# Patient Record
Sex: Female | Born: 1975 | Race: White | Hispanic: No | Marital: Married | Smoking: Never smoker
Health system: Southern US, Community
[De-identification: ages and names within clinical notes are randomized; demographics above are authoritative.]

## PROBLEM LIST (undated history)

## (undated) DIAGNOSIS — F32A Depression, unspecified: Secondary | ICD-10-CM

## (undated) DIAGNOSIS — I10 Essential (primary) hypertension: Secondary | ICD-10-CM

## (undated) DIAGNOSIS — G43909 Migraine, unspecified, not intractable, without status migrainosus: Secondary | ICD-10-CM

## (undated) DIAGNOSIS — B009 Herpesviral infection, unspecified: Secondary | ICD-10-CM

## (undated) DIAGNOSIS — J45909 Unspecified asthma, uncomplicated: Secondary | ICD-10-CM

## (undated) DIAGNOSIS — K5792 Diverticulitis of intestine, part unspecified, without perforation or abscess without bleeding: Secondary | ICD-10-CM

## (undated) DIAGNOSIS — F419 Anxiety disorder, unspecified: Secondary | ICD-10-CM

## (undated) DIAGNOSIS — F329 Major depressive disorder, single episode, unspecified: Secondary | ICD-10-CM

## (undated) HISTORY — DX: Major depressive disorder, single episode, unspecified: F32.9

## (undated) HISTORY — DX: Essential (primary) hypertension: I10

## (undated) HISTORY — PX: LEEP: SHX91

## (undated) HISTORY — PX: APPENDECTOMY: SHX54

## (undated) HISTORY — DX: Depression, unspecified: F32.A

## (undated) HISTORY — PX: INSERTION OF MESH: SHX5868

## (undated) HISTORY — DX: Herpesviral infection, unspecified: B00.9

## (undated) HISTORY — DX: Unspecified asthma, uncomplicated: J45.909

## (undated) HISTORY — DX: Anxiety disorder, unspecified: F41.9

## (undated) HISTORY — DX: Migraine, unspecified, not intractable, without status migrainosus: G43.909

## (undated) HISTORY — DX: Diverticulitis of intestine, part unspecified, without perforation or abscess without bleeding: K57.92

---

## 2001-11-26 ENCOUNTER — Other Ambulatory Visit: Admission: RE | Admit: 2001-11-26 | Discharge: 2001-11-26 | Payer: Self-pay | Admitting: Obstetrics and Gynecology

## 2003-02-23 ENCOUNTER — Other Ambulatory Visit: Admission: RE | Admit: 2003-02-23 | Discharge: 2003-02-23 | Payer: Self-pay | Admitting: Obstetrics and Gynecology

## 2003-06-22 ENCOUNTER — Encounter: Payer: Self-pay | Admitting: Obstetrics and Gynecology

## 2003-06-22 ENCOUNTER — Encounter: Admission: RE | Admit: 2003-06-22 | Discharge: 2003-06-22 | Payer: Self-pay | Admitting: Obstetrics and Gynecology

## 2004-02-23 ENCOUNTER — Other Ambulatory Visit: Admission: RE | Admit: 2004-02-23 | Discharge: 2004-02-23 | Payer: Self-pay | Admitting: Obstetrics and Gynecology

## 2004-06-27 ENCOUNTER — Ambulatory Visit (HOSPITAL_COMMUNITY): Admission: RE | Admit: 2004-06-27 | Discharge: 2004-06-27 | Payer: Self-pay | Admitting: Obstetrics and Gynecology

## 2004-09-13 ENCOUNTER — Inpatient Hospital Stay (HOSPITAL_COMMUNITY): Admission: RE | Admit: 2004-09-13 | Discharge: 2004-09-16 | Payer: Self-pay | Admitting: Obstetrics and Gynecology

## 2004-09-19 ENCOUNTER — Encounter: Admission: RE | Admit: 2004-09-19 | Discharge: 2004-10-19 | Payer: Self-pay | Admitting: Obstetrics and Gynecology

## 2004-11-19 ENCOUNTER — Encounter: Admission: RE | Admit: 2004-11-19 | Discharge: 2004-12-19 | Payer: Self-pay | Admitting: Obstetrics and Gynecology

## 2005-09-05 ENCOUNTER — Other Ambulatory Visit: Admission: RE | Admit: 2005-09-05 | Discharge: 2005-09-05 | Payer: Self-pay | Admitting: Obstetrics and Gynecology

## 2006-09-06 ENCOUNTER — Other Ambulatory Visit: Admission: RE | Admit: 2006-09-06 | Discharge: 2006-09-06 | Payer: Self-pay | Admitting: Obstetrics and Gynecology

## 2007-01-13 ENCOUNTER — Ambulatory Visit: Payer: Self-pay | Admitting: Family Medicine

## 2007-01-29 ENCOUNTER — Ambulatory Visit: Payer: Self-pay | Admitting: Family Medicine

## 2007-06-10 ENCOUNTER — Ambulatory Visit: Payer: Self-pay | Admitting: Family Medicine

## 2007-12-08 ENCOUNTER — Ambulatory Visit (HOSPITAL_COMMUNITY): Admission: RE | Admit: 2007-12-08 | Discharge: 2007-12-08 | Payer: Self-pay | Admitting: Obstetrics and Gynecology

## 2008-02-15 ENCOUNTER — Inpatient Hospital Stay (HOSPITAL_COMMUNITY): Admission: AD | Admit: 2008-02-15 | Discharge: 2008-02-17 | Payer: Self-pay | Admitting: Obstetrics and Gynecology

## 2008-11-23 ENCOUNTER — Ambulatory Visit: Payer: Self-pay | Admitting: Family Medicine

## 2009-02-02 ENCOUNTER — Ambulatory Visit: Payer: Self-pay | Admitting: Family Medicine

## 2009-08-10 ENCOUNTER — Ambulatory Visit: Payer: Self-pay | Admitting: Family Medicine

## 2009-09-08 ENCOUNTER — Ambulatory Visit: Payer: Self-pay | Admitting: Family Medicine

## 2010-02-03 ENCOUNTER — Ambulatory Visit: Payer: Self-pay | Admitting: Family Medicine

## 2010-02-08 ENCOUNTER — Ambulatory Visit: Payer: Self-pay | Admitting: Physician Assistant

## 2010-02-09 ENCOUNTER — Encounter: Admission: RE | Admit: 2010-02-09 | Discharge: 2010-02-09 | Payer: Self-pay | Admitting: Family Medicine

## 2010-10-25 ENCOUNTER — Ambulatory Visit: Payer: Self-pay | Admitting: Family Medicine

## 2010-11-16 ENCOUNTER — Ambulatory Visit
Admission: RE | Admit: 2010-11-16 | Discharge: 2010-11-16 | Payer: Self-pay | Source: Home / Self Care | Attending: Family Medicine | Admitting: Family Medicine

## 2010-11-19 LAB — HM COLONOSCOPY

## 2010-12-14 ENCOUNTER — Ambulatory Visit: Admit: 2010-12-14 | Payer: Self-pay | Admitting: Family Medicine

## 2011-04-03 NOTE — Discharge Summary (Signed)
[name], [name]              ACCOUNT NO.:  [id number]   MEDICAL RECORD NO.:  [id number]          PATIENT TYPE:  INP   LOCATION:  9107                          FACILITY:  [facility]   PHYSICIAN:  Malachi Pro. Ambrose Mantle, M.D. DATE OF BIRTH:  12/17/1975   DATE OF ADMISSION:  02/15/2008  DATE OF DISCHARGE:  02/17/2008                               DISCHARGE SUMMARY   HISTORY:  A 35 year old white married female para 1-0-0-1, gravida 2,  EDC March 01, 2008, admitted with active labor and pregnancy-induced  high blood pressure.  Blood group and type A negative, negative  antibody, nonreactive serology, rubella immune, hepatitis B surface  antigen negative, HIV negative, first trimester screen negative, group B  strep negative, and 1-hour Glucola 97.  Early ultrasounds placed the due  date at March 01, 2008.  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  She has been on Valtrex for suppression  since that time.  She began contracting on the day of admission, came to  MAU for evaluation.  She was 2-3 cm in the MAU, was admitted, and by the  time I saw  her, was 6 cm dilated.   ALLERGIES:  No known allergies.   PAST SURGICAL HISTORY:  Appendectomy in 1992 and LEEP procedure.   ILLNESSES:  Asthma, migraines, and herpes.   SOCIAL HISTORY:  Alcohol, tobacco, and drugs none.   FAMILY HISTORY:  Mother with high blood pressure, depression, and  alcoholism.  Brother with alcoholism, high blood pressure, and sister  with anxiety.   PHYSICAL EXAMINATION:  VITAL SIGNS:  Normal except for blood pressure  147/91.  HEART/LUNGS:  Normal.  ABDOMEN:  Soft.  GU: Fundal height had been 35 cm on February 09, 2008.  Fetal heart tones  were normal.  Cervix was 6 cm, 100% vertex to zero station.  Membranes  ruptured during my exam and meconium staining was noted.   HOSPITAL COURSE:  The patient requested an epidural.  She received the  epidural, reached full dilatation at 11:43 a.m.   There was meconium  stained fluid.  Fetal heart rate bradycardia, corrected with lateral  displacement of the uterus.  The patient delivered spontaneously OA of a  first degree laceration, a living female infant 5 pounds and 11 ounces.  Apgars of 9 at 1 and 9 at 5 minutes.  The nose and pharynx were  suctioned with DeLee and the bulb with the head on the perineum.  Placenta was intact.  Uterus normal.  Blood loss about 400 mL.  First  degree midline laceration repaired with 3-0 Vicryl.  Postpartum, the  patient did well and was discharged on the second postpartum day.   LABORATORY DATA:  Initial hemoglobin 12.8, hematocrit 36.6, white count  10,100, and platelet count 242,000.  Followup hemoglobin 11.3.  RPR was  nonreactive.  Urinalysis showed no protein.  The [facility] panel was normal  for liver enzymes.  RhoGAM was given on February 16, 2008.   FINAL DIAGNOSES:  1. Intrauterine pregnancy at 38 weeks delivered occipitoanterior.  2. Pregnancy-induced high blood pressure.  FINAL CONDITION:  Improved.   INSTRUCTIONS:  Include our regular discharge instruction booklet.  The  patient is advised to return to the office in 6 weeks.  Motrin 600 mg 30  tablets one every 6 hours as needed for pain is given at discharge.      Malachi Pro. Ambrose Mantle, M.D.  Electronically Signed     TFH/MEDQ  D:  02/17/2008  T:  02/17/2008  Job:  [id number]

## 2011-04-06 NOTE — Discharge Summary (Signed)
[name] [name], [name] [name]              ACCOUNT NO.:  [id number]   MEDICAL RECORD NO.:  [id number]          PATIENT TYPE:  INP   LOCATION:  9145                          [facility]  [facility]   PHYSICIAN:  Zenaida Niece, M.D.DATE OF BIRTH:  1976-06-23   DATE OF ADMISSION:  09/13/2004  DATE OF DISCHARGE:  09/16/2004                                 DISCHARGE SUMMARY   ADMISSION DIAGNOSES:  1.  Intrauterine pregnancy at 39 weeks.  2.  Pregnancy induced hypertension.  3.  History of herpes simplex virus.  4.  Elevated risk of Trisomy 21.   DISCHARGE DIAGNOSES:  1.  Intrauterine pregnancy at 39 weeks.  2.  Pregnancy induced hypertension.  3.  History of herpes simplex virus.  4.  Elevated risk of Trisomy 21.  5.  Postpartum hemorrhage.   PROCEDURE:  On October 27, spontaneous vaginal delivery.   HISTORY OF PRESENT ILLNESS:  This is a 35 year old, white female, G1, P0  with an EGA of [redacted] weeks who presented with complaint of possible rupture of  membranes with contractions, bloody show and positive fetal movement.  Evaluation did not confirm rupture of membranes, but her cervix changed from  1-3 cm.  Blood pressure was slightly elevated with essentially normal labs  and no proteinuria.   PRENATAL COURSE:  Prenatal care complicated by 1/213 risk of Trisomy 21 by  triple screen.  She has been on Valtrex suppression without lesions or  symptoms.  She had an ultrasound on October 5, for size less than dates with  an estimated fetal weight of 2600 g which is 25th-50th percentile for  gestational age and an AFI of 14.6.  She has had reactive nonstress test for  size less than dates and occasional elevated blood pressures with normal  labs.   PRENATAL LABORATORY DATA:  Blood type is A negative with a negative antibody  screen.  RPR nonreactive.  Rubella immune.  Hepatitis B surface antigen  negative.  Gonorrhea and Chlamydia negative.  One-hour Glucola 107.  Group B  Streptococcus negative.   PAST GYNECOLOGIC HISTORY:  History of herpes simplex virus without current  symptoms.  History of LEEP and cryotherapy.   PAST MEDICAL HISTORY:  1.  Asthma.  2.  Migraine headaches.   PAST SURGICAL HISTORY:  Appendectomy.   MEDICATIONS:  Valtrex 500 mg q.d.   PHYSICAL EXAMINATION:  VITAL SIGNS:  Afebrile, stable vital signs except  blood pressure is 120-150s/80-100s.  Fetal heart tracing reactive initially  with irregular contractions.  ABDOMEN:  Gravid, nontender with an estimated fetal weight of 6-1/2 pounds.  PELVIC:  Cervix on my first exam at 5, 90 and 0 with a vertex presentation.  Adequate pelvis and amniotomy revealed possible light meconium.  EXTREMITIES:  1+ edema, nontender.  DTRs were 1/4.   HOSPITAL COURSE:  The patient was admitted after being felt to be in early  labor.  Contractions spaced out and she was started on Pitocin augmentation.  On my first exam, I was able to perform amniotomy for augmentation.  She  progressed into labor and received an epidural.  Blood pressures  remained  slightly elevated.  When Dr. Ambrose Mantle saw the patient on the morning of  October 27, he started her on magnesium sulfate.  On the morning of October  27, she progressed to complete, pushed well and had a vaginal delivery of a  viable female infant with Apgar's of 9 and 9 that weighed 6 pounds 13  ounces.  Placenta delivered intact and uterus palpated normal.  She had a  second-degree midline laceration and bilateral vaginal lacerations repaired  with 3-0 Vicryl and estimated blood loss was 500 cc.  After delivery, Dr.  Ambrose Mantle was called due to heavy vaginal bleeding.  Dr. Ambrose Mantle evaluated her  and felt that there was no significant bleeding and that her fundus was  firm.  Blood pressure was 138/87, pulse was fine and hemoglobin at that  point was 10.6.  Predelivery hemoglobin was 14.3 and then after delivery  10.6.  It fell to 8.4 on postpartum day #1 and 7.7 on postpartum day #2.  She  was kept on magnesium sulfate for approximately 24 hours after delivery.  This was then discontinued and she was sent to the floor.  Blood pressures  remained stable on the floor, although initially slightly labile.  She did  have some trouble controlling her urine, but on postpartum day #2, was felt  to be stable enough for discharge home.   DIET:  Regular.   SPECIAL INSTRUCTIONS:  Pelvic rest.   FOLLOW UP:  Follow up in 6 weeks.   DISCHARGE MEDICATIONS:  Over-the-counter ibuprofen and Percocet #20 one to  two p.o. q.4-6h. p.r.n. pain.  She is given our discharge pamphlet.  Iron  sulfate b.i.d.     Todd   TDM/MEDQ  D:  09/16/2004  T:  09/16/2004  Job:  [id number]

## 2011-08-09 LAB — RH IMMUNE GLOBULIN WORKUP (NOT WOMEN'S HOSP)
ABO/RH(D): A NEG
Antibody Screen: NEGATIVE

## 2011-08-09 LAB — HIV ANTIBODY (ROUTINE TESTING W REFLEX): HIV: NONREACTIVE

## 2011-08-09 LAB — GLUCOSE TOLERANCE, 1 HOUR: Glucose, 1 Hour GTT: 97

## 2011-08-09 LAB — RPR: RPR Ser Ql: NONREACTIVE

## 2011-08-13 LAB — COMPREHENSIVE METABOLIC PANEL
ALT: 16
AST: 22
Albumin: 2.8 — ABNORMAL LOW
Alkaline Phosphatase: 171 — ABNORMAL HIGH
BUN: 7
CO2: 19
Calcium: 8.7
Chloride: 105
Creatinine, Ser: 0.52
GFR calc Af Amer: >60
GFR calc non Af Amer: >60
Glucose, Bld: 92
Potassium: 3.4 — ABNORMAL LOW
Sodium: 133 — ABNORMAL LOW
Total Bilirubin: 0.8
Total Protein: 6.9

## 2011-08-13 LAB — CBC
HCT: 32.8 — ABNORMAL LOW
HCT: 36.6
Hemoglobin: 11.3 — ABNORMAL LOW
Hemoglobin: 12.8
MCHC: 34.5
MCHC: 34.8
MCV: 81.6
MCV: 82.2
Platelets: 227
Platelets: 242
RBC: 3.99
RBC: 4.49
RDW: 12.9
RDW: 13.1
WBC: 10.1
WBC: 11.1 — ABNORMAL HIGH

## 2011-08-13 LAB — URINALYSIS, DIPSTICK ONLY
Bilirubin Urine: NEGATIVE
Glucose, UA: NEGATIVE
Hgb urine dipstick: NEGATIVE
Ketones, ur: >80 — AB
Leukocytes, UA: NEGATIVE
Nitrite: NEGATIVE
Protein, ur: NEGATIVE
Specific Gravity, Urine: 1.025
Urobilinogen, UA: 1
pH: 6.5

## 2011-08-13 LAB — URIC ACID: Uric Acid, Serum: 4.2

## 2011-08-13 LAB — RPR: RPR Ser Ql: NONREACTIVE

## 2011-08-13 LAB — RH IMMUNE GLOB WKUP(>/=20WKS)(NOT WOMEN'S HOSP): Fetal Screen: NEGATIVE

## 2011-08-13 LAB — LACTATE DEHYDROGENASE: LDH: 141

## 2013-05-19 LAB — HM PAP SMEAR: HM Pap smear: NORMAL

## 2013-11-09 ENCOUNTER — Encounter: Payer: Self-pay | Admitting: Internal Medicine

## 2013-11-10 ENCOUNTER — Ambulatory Visit: Payer: Self-pay | Admitting: Physician Assistant

## 2014-02-18 ENCOUNTER — Other Ambulatory Visit: Payer: Self-pay | Admitting: Emergency Medicine

## 2014-03-20 ENCOUNTER — Other Ambulatory Visit: Payer: Self-pay | Admitting: Emergency Medicine

## 2014-03-23 ENCOUNTER — Encounter: Payer: Self-pay | Admitting: Internal Medicine

## 2014-03-23 NOTE — Progress Notes (Signed)
Patient ID: [name] [name], female   DOB: [birth year], 38 y.o.   MRN: [id number]     Stephenie [facility] O   S H O W

## 2014-03-24 ENCOUNTER — Encounter: Payer: Self-pay | Admitting: Internal Medicine

## 2014-05-03 ENCOUNTER — Encounter: Payer: Self-pay | Admitting: Family Medicine

## 2014-05-03 ENCOUNTER — Ambulatory Visit (INDEPENDENT_AMBULATORY_CARE_PROVIDER_SITE_OTHER): Payer: BC Managed Care – PPO | Admitting: Family Medicine

## 2014-05-03 VITALS — BP 120/80 | HR 76 | Temp 98.1°F | Resp 16 | Wt 181.1 lb

## 2014-05-03 DIAGNOSIS — R319 Hematuria, unspecified: Secondary | ICD-10-CM

## 2014-05-03 DIAGNOSIS — R82998 Other abnormal findings in urine: Secondary | ICD-10-CM

## 2014-05-03 DIAGNOSIS — R300 Dysuria: Secondary | ICD-10-CM

## 2014-05-03 DIAGNOSIS — H6980 Other specified disorders of Eustachian tube, unspecified ear: Secondary | ICD-10-CM

## 2014-05-03 DIAGNOSIS — R8290 Unspecified abnormal findings in urine: Secondary | ICD-10-CM | POA: Insufficient documentation

## 2014-05-03 DIAGNOSIS — H6990 Unspecified Eustachian tube disorder, unspecified ear: Secondary | ICD-10-CM | POA: Insufficient documentation

## 2014-05-03 LAB — POCT URINALYSIS DIPSTICK
Bilirubin, UA: NEGATIVE
Glucose, UA: NEGATIVE
Ketones, UA: NEGATIVE
Nitrite, UA: NEGATIVE
Spec Grav, UA: >=1.03
Urobilinogen, UA: 0.2
pH, UA: 6

## 2014-05-03 NOTE — Progress Notes (Signed)
   Subjective:    Patient ID: [name] [name], female    DOB: 1976-10-19, 38 y.o.   MRN: [id number]  Dysuria    New to establish.  Previous MD- [name] FeeMelissa Smith PA [facility]  Urinary symptoms- pt reports she had Mirena inserted in September and since then, 'urine has been really strong'.  For last month urine has smelled worse and now w/ back pain.  No burning w/ urination.  Some increased frequency but this corresponds w/ increased water intake.  No urgency.  Ear clogged- L ear, has sensation of water in the ear despite no recent swimming.  sxs started 1 week ago.  No drainage.  + itching.   Review of Systems  Genitourinary: Positive for dysuria.   For ROS see HPI     Objective:   Physical Exam  Vitals reviewed. Constitutional: She appears well-developed and well-nourished. No distress.  HENT:  Head: Normocephalic and atraumatic.  Right Ear: Tympanic membrane normal.  Left Ear: Tympanic membrane normal.  Nose: Mucosal edema and rhinorrhea present. Right sinus exhibits no maxillary sinus tenderness and no frontal sinus tenderness. Left sinus exhibits no maxillary sinus tenderness and no frontal sinus tenderness.  Mouth/Throat: Mucous membranes are normal. Posterior oropharyngeal erythema (w/ PND) present.  Eyes: Conjunctivae and EOM are normal. Pupils are equal, round, and reactive to light.  Neck: Normal range of motion. Neck supple.  Cardiovascular: Normal rate, regular rhythm and normal heart sounds.   Pulmonary/Chest: Effort normal and breath sounds normal. No respiratory distress. She has no wheezes. She has no rales.  Abdominal: Soft. Bowel sounds are normal. She exhibits no distension. There is no tenderness. There is no rebound.  Lymphadenopathy:    She has no cervical adenopathy.          Assessment & Plan:

## 2014-05-03 NOTE — Progress Notes (Signed)
Pre visit review using our clinic review tool, if applicable. No additional management support is needed unless otherwise documented below in the visit note. 

## 2014-05-03 NOTE — Patient Instructions (Signed)
Follow up as scheduled Increase your fluid intake We'll notify you of your urine culture results and treat as needed Start Claritin or Zyrtec daily to decrease the ear pressure and relieve that clogged feeling Call with any questions or concerns Welcome!  We're glad to have you!!!

## 2014-05-04 ENCOUNTER — Encounter: Payer: Self-pay | Admitting: General Practice

## 2014-05-04 NOTE — Assessment & Plan Note (Signed)
New.  sxs are more consistent w/ hormonal change or dehydration than UTI but will send urine for cx.  No abx until cx results available.  Pt expressed understanding and is in agreement w/ plan.

## 2014-05-04 NOTE — Assessment & Plan Note (Signed)
New.  No evidence of wax buildup or infxn.  + nasal allergies on PE.  Suspect Eustachian tube dysfunction is the cause of her ear fullness- start OTC antihistamine daily.  Reviewed supportive care and red flags that should prompt return.  Pt expressed understanding and is in agreement w/ plan.

## 2014-05-05 LAB — URINE CULTURE
Colony Count: NO GROWTH
Organism ID, Bacteria: NO GROWTH

## 2014-05-06 ENCOUNTER — Telehealth: Payer: Self-pay | Admitting: Family Medicine

## 2014-05-06 ENCOUNTER — Encounter: Payer: Self-pay | Admitting: General Practice

## 2014-05-06 NOTE — Telephone Encounter (Signed)
Caller name: Aram BeechamCynthia  Call back [phone]    Reason for call:  Pt had received a call regarding labs.  Would like a call back.

## 2014-05-07 NOTE — Telephone Encounter (Signed)
Pt notified of lab results. Verbalized understanding / no questions.

## 2014-08-06 ENCOUNTER — Ambulatory Visit (INDEPENDENT_AMBULATORY_CARE_PROVIDER_SITE_OTHER): Payer: BC Managed Care – PPO | Admitting: Family Medicine

## 2014-08-06 ENCOUNTER — Other Ambulatory Visit: Payer: Self-pay | Admitting: Family Medicine

## 2014-08-06 ENCOUNTER — Encounter: Payer: Self-pay | Admitting: Family Medicine

## 2014-08-06 VITALS — BP 124/82 | HR 80 | Temp 98.0°F | Resp 16 | Ht 67.5 in | Wt 177.1 lb

## 2014-08-06 DIAGNOSIS — E663 Overweight: Secondary | ICD-10-CM | POA: Insufficient documentation

## 2014-08-06 DIAGNOSIS — R5383 Other fatigue: Secondary | ICD-10-CM

## 2014-08-06 DIAGNOSIS — J309 Allergic rhinitis, unspecified: Secondary | ICD-10-CM

## 2014-08-06 DIAGNOSIS — F32A Depression, unspecified: Secondary | ICD-10-CM

## 2014-08-06 DIAGNOSIS — R8290 Unspecified abnormal findings in urine: Secondary | ICD-10-CM

## 2014-08-06 DIAGNOSIS — J302 Other seasonal allergic rhinitis: Secondary | ICD-10-CM | POA: Insufficient documentation

## 2014-08-06 DIAGNOSIS — F3289 Other specified depressive episodes: Secondary | ICD-10-CM

## 2014-08-06 DIAGNOSIS — K219 Gastro-esophageal reflux disease without esophagitis: Secondary | ICD-10-CM | POA: Insufficient documentation

## 2014-08-06 DIAGNOSIS — N393 Stress incontinence (female) (male): Secondary | ICD-10-CM | POA: Insufficient documentation

## 2014-08-06 DIAGNOSIS — R82998 Other abnormal findings in urine: Secondary | ICD-10-CM

## 2014-08-06 DIAGNOSIS — R5381 Other malaise: Secondary | ICD-10-CM

## 2014-08-06 DIAGNOSIS — F329 Major depressive disorder, single episode, unspecified: Secondary | ICD-10-CM

## 2014-08-06 DIAGNOSIS — Z23 Encounter for immunization: Secondary | ICD-10-CM

## 2014-08-06 LAB — CBC WITH DIFFERENTIAL/PLATELET
Basophils Absolute: 0 10*3/uL (ref 0.0–0.1)
Basophils Relative: 0 % (ref 0–1)
Eosinophils Absolute: 0.1 10*3/uL (ref 0.0–0.7)
Eosinophils Relative: 1 % (ref 0–5)
HCT: 40.8 % (ref 36.0–46.0)
Hemoglobin: 14.9 g/dL (ref 12.0–15.0)
Lymphocytes Relative: 30 % (ref 12–46)
Lymphs Abs: 2.4 10*3/uL (ref 0.7–4.0)
MCH: 28.6 pg (ref 26.0–34.0)
MCHC: 36.5 g/dL — ABNORMAL HIGH (ref 30.0–36.0)
MCV: 78.3 fL (ref 78.0–100.0)
Monocytes Absolute: 0.6 10*3/uL (ref 0.1–1.0)
Monocytes Relative: 8 % (ref 3–12)
Neutro Abs: 4.9 10*3/uL (ref 1.7–7.7)
Neutrophils Relative %: 61 % (ref 43–77)
Platelets: 262 10*3/uL (ref 150–400)
RBC: 5.21 MIL/uL — ABNORMAL HIGH (ref 3.87–5.11)
RDW: 12.9 % (ref 11.5–15.5)
WBC: 8.1 10*3/uL (ref 4.0–10.5)

## 2014-08-06 LAB — BASIC METABOLIC PANEL
BUN: 13 mg/dL (ref 6–23)
CO2: 25 mEq/L (ref 19–32)
Calcium: 9.4 mg/dL (ref 8.4–10.5)
Chloride: 102 mEq/L (ref 96–112)
Creat: 0.78 mg/dL (ref 0.50–1.10)
Glucose, Bld: 85 mg/dL (ref 70–99)
Potassium: 3.9 mEq/L (ref 3.5–5.3)
Sodium: 136 mEq/L (ref 135–145)

## 2014-08-06 LAB — HEPATIC FUNCTION PANEL
ALT: 9 U/L (ref 0–35)
AST: 14 U/L (ref 0–37)
Albumin: 4.5 g/dL (ref 3.5–5.2)
Alkaline Phosphatase: 47 U/L (ref 39–117)
Bilirubin, Direct: 0.1 mg/dL (ref 0.0–0.3)
Indirect Bilirubin: 0.4 mg/dL (ref 0.2–1.2)
Total Bilirubin: 0.5 mg/dL (ref 0.2–1.2)
Total Protein: 7.3 g/dL (ref 6.0–8.3)

## 2014-08-06 LAB — LIPID PANEL
Cholesterol: 188 mg/dL (ref 0–200)
HDL: 57 mg/dL (ref >39)
LDL Cholesterol: 105 mg/dL — ABNORMAL HIGH (ref 0–99)
Total CHOL/HDL Ratio: 3.3 Ratio
Triglycerides: 131 mg/dL (ref <150)
VLDL: 26 mg/dL (ref 0–40)

## 2014-08-06 MED ORDER — OMEPRAZOLE 20 MG PO CPDR: 20.0000 mg | DELAYED_RELEASE_CAPSULE | Freq: Every day | ORAL | Status: DC

## 2014-08-06 NOTE — Progress Notes (Signed)
Pre visit review using our clinic review tool, if applicable. No additional management support is needed unless otherwise documented below in the visit note. 

## 2014-08-06 NOTE — Telephone Encounter (Signed)
Med filled.  

## 2014-08-06 NOTE — Progress Notes (Signed)
   Subjective:    Patient ID: [name] [name], female    DOB: [birth year], 38 y.o.   MRN: [id number]  HPI GERD- pt reports hx of similar 'years' ago.  In the past few months pt feels sxs have returned and she is having a 'constant cough'.  No abd pain, sour brash.  Pt reports the cough predated the PND.  Allergies- pt reports nasal congestion, sinus pressure, itchy eyes, PND.  No sneezing.  Not currently taking any allergy medication.  sxs started 3 weeks ago.  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  Depression- chronic problem, stopped taking Zoloft and 'depression is ok' but 'i have a really short temper'.  Started meds when mom passed away 5 yrs ago.  Pt reports no difficulty w/ sleep, 'super tired all the time'.  Stopped Zoloft b/c 'i hate taking medicine'.  + family hx.  Not interested in counseling.  Stress incontinence- occuring w/ cough, running, walking quickly, jumping.  Since birth of 1st child 10 yrs ago.   Review of Systems For ROS see HPI     Objective:   Physical Exam  Vitals reviewed. Constitutional: She is oriented to person, place, and time. She appears well-developed and well-nourished. No distress.  HENT:  Head: Normocephalic and atraumatic.  Right Ear: Tympanic membrane normal.  Left Ear: Tympanic membrane normal.  Nose: Mucosal edema and rhinorrhea present. Right sinus exhibits no maxillary sinus tenderness and no frontal sinus tenderness. Left sinus exhibits no maxillary sinus tenderness and no frontal sinus tenderness.  Mouth/Throat: Mucous membranes are normal. Posterior oropharyngeal erythema (w/ PND) present.  Eyes: Conjunctivae and EOM are normal. Pupils are equal, round, and reactive to light.  Neck: Normal range of motion. Neck supple.  Cardiovascular: Normal rate, regular rhythm and normal heart sounds.   Pulmonary/Chest: Effort normal and breath sounds normal. No respiratory distress. She has no wheezes. She has no rales.  Abdominal: Soft. Bowel sounds  are normal. She exhibits no distension. There is no tenderness. There is no rebound.  Musculoskeletal: She exhibits no edema.  Lymphadenopathy:    She has no cervical adenopathy.  Neurological: She is alert and oriented to person, place, and time.  Skin: Skin is warm and dry.  Psychiatric: She has a normal mood and affect. Her behavior is normal. Thought content normal.          Assessment & Plan:

## 2014-08-06 NOTE — Patient Instructions (Signed)
Schedule your complete physical in 6 months We'll notify you of your lab results and make any changes if needed We'll call you with your urology appt for the stress incontinence Please work on Kegel exercises to strengthen your pelvic floor Start daily OTC claritin or zyrtec for the allergies Start daily Omeprazole for the acid reflux If you change your mind about depression medication- please call me Call with any questions or concerns Happy [birth year] Birthday!

## 2014-08-07 LAB — TSH: TSH: 0.833 u[IU]/mL (ref 0.350–4.500)

## 2014-08-07 LAB — VITAMIN D 25 HYDROXY (VIT D DEFICIENCY, FRACTURES): Vit D, 25-Hydroxy: 60 ng/mL (ref 30–89)

## 2014-08-08 NOTE — Assessment & Plan Note (Signed)
New to provider, ongoing for pt.  She weaned off meds b/c 'i don't like taking medicine'.  Pt admits to increased fatigue and short fuse but refusing medication and counseling- 'i'll just deal w/ it'.  Will follow and treat once pt is ready.

## 2014-08-08 NOTE — Assessment & Plan Note (Signed)
New.  Encouraged pt to make healthy food choices and get regular exercise.  Will get labs to risk stratify.  Will follow at upcoming appts

## 2014-08-08 NOTE — Assessment & Plan Note (Signed)
New to provider, ongoing for pt.  She feels strongly that her chronic cough is due to GERD.  Start low dose PPI and monitor for sxs improvement.  Will follow.

## 2014-08-08 NOTE — Assessment & Plan Note (Signed)
New to provider, ongoing for pt since birth of 1st child 10 yrs ago.  Pt encouraged to work on Kegel exercises to strengthen pelvic floor.  Will refer to urology for evaluation and tx.

## 2014-08-08 NOTE — Assessment & Plan Note (Signed)
Pt w/ hx of similar.  Discussed that diet and medication intake can alter urine odor.  Repeat UA and send for cx if needed.

## 2014-08-08 NOTE — Assessment & Plan Note (Signed)
New.  Suspect this is due to pt's currently untreated depression but will get labs to r/o underlying metabolic abnormality.

## 2014-08-08 NOTE — Assessment & Plan Note (Signed)
New.  Pt reports she is not taking anything to manage her sxs.  Start OTC antihistamine daily.

## 2014-08-09 ENCOUNTER — Encounter: Payer: Self-pay | Admitting: General Practice

## 2014-08-09 LAB — POCT URINALYSIS DIPSTICK
Bilirubin, UA: NEGATIVE
Blood, UA: 10
Glucose, UA: NEGATIVE
Ketones, UA: NEGATIVE
Leukocytes, UA: NEGATIVE
Nitrite, UA: NEGATIVE
Protein, UA: NEGATIVE
Spec Grav, UA: 1.015
Urobilinogen, UA: 0.2
pH, UA: 6

## 2014-08-09 NOTE — Addendum Note (Signed)
Addended by: Eustace Quail on: 08/09/2014 09:30 AM   Modules accepted: Orders

## 2014-08-11 ENCOUNTER — Encounter: Payer: Self-pay | Admitting: General Practice

## 2014-08-11 ENCOUNTER — Telehealth: Payer: Self-pay | Admitting: Lab

## 2014-08-11 LAB — URINE CULTURE: Colony Count: >=100000

## 2014-08-11 LAB — FOLATE: Folate: 19.1 ng/mL

## 2014-08-11 LAB — VITAMIN B12: Vitamin B-12: 742 pg/mL (ref 211–911)

## 2014-08-11 NOTE — Telephone Encounter (Signed)
Message copied by Vance Gather on Wed Aug 11, 2014 10:42 AM ------      Message from: Sheliah Hatch      Created: Wed Aug 11, 2014  8:28 AM       Please call and check on status of B12/folate            ----- Message -----         From: SYSTEM         Sent: 08/11/2014  12:01 AM           To: Sheliah Hatch, MD                   ------

## 2014-08-11 NOTE — Telephone Encounter (Signed)
Dr. Beverely Low    I just talked to [facility] you should have your result for the vitamin b12 and folate by tomorrow.

## 2014-08-12 ENCOUNTER — Other Ambulatory Visit: Payer: Self-pay | Admitting: General Practice

## 2014-08-12 MED ORDER — CEPHALEXIN 500 MG PO CAPS: 500.0000 mg | ORAL_CAPSULE | Freq: Two times a day (BID) | ORAL | Status: DC

## 2014-09-08 ENCOUNTER — Other Ambulatory Visit: Payer: Self-pay | Admitting: Family Medicine

## 2014-09-08 ENCOUNTER — Telehealth: Payer: Self-pay | Admitting: Family Medicine

## 2014-09-08 MED ORDER — ESCITALOPRAM OXALATE 10 MG PO TABS: 10.0000 mg | ORAL_TABLET | Freq: Every day | ORAL | Status: DC

## 2014-09-08 NOTE — Telephone Encounter (Signed)
Lexapro 10mg  daily would be a good option if she is interested

## 2014-09-08 NOTE — Telephone Encounter (Signed)
Med filled.  

## 2014-09-08 NOTE — Telephone Encounter (Signed)
Pt notified and med filled.  

## 2014-09-08 NOTE — Telephone Encounter (Signed)
Caller name: Aram BeechamCynthia  Call back number:[phone] [facility] [address]  Reason for call:  Pt tried to wean herself off of her off the Rx sertraline (ZOLOFT) 100 MG tablet [[id number]][[id number]] DISCONTINUED, but she feels that she may want to be back on something, prob not the Zoloft.  Would like to discuss.  Please call.

## 2014-09-23 ENCOUNTER — Encounter: Payer: Self-pay | Admitting: Emergency Medicine

## 2015-01-11 ENCOUNTER — Other Ambulatory Visit: Payer: Self-pay | Admitting: Family Medicine

## 2015-01-12 NOTE — Telephone Encounter (Signed)
Med filled.  

## 2015-02-14 ENCOUNTER — Ambulatory Visit: Payer: BC Managed Care – PPO | Admitting: Family Medicine

## 2015-02-28 ENCOUNTER — Ambulatory Visit (INDEPENDENT_AMBULATORY_CARE_PROVIDER_SITE_OTHER): Payer: BC Managed Care – PPO | Admitting: Family Medicine

## 2015-02-28 ENCOUNTER — Encounter: Payer: Self-pay | Admitting: Family Medicine

## 2015-02-28 VITALS — BP 120/80 | HR 67 | Temp 98.2°F | Resp 16 | Ht 68.0 in | Wt 181.0 lb

## 2015-02-28 DIAGNOSIS — Z0000 Encounter for general adult medical examination without abnormal findings: Secondary | ICD-10-CM

## 2015-02-28 NOTE — Assessment & Plan Note (Signed)
Pt's PE WNL w/ exception of being overweight.  UTD on GYN.  Check labs.  Anticipatory guidance provided.  

## 2015-02-28 NOTE — Patient Instructions (Signed)
Follow up in 1 year or as needed We'll notify you of your lab results and make any changes if needed Keep up the good work on healthy diet and regular exercise- you can do this! Call with any questions or concerns Happy Spring!!!

## 2015-02-28 NOTE — Progress Notes (Signed)
   Subjective:    Patient ID: [name] [name], female    DOB: [birth year], 39 y.o.   MRN: [id number]  HPI CPE- UTD on pap w/ Dr Senaida Oresichardson.     Review of Systems Patient reports no vision/ hearing changes, adenopathy,fever, weight change,  persistant/recurrent hoarseness , swallowing issues, chest pain, palpitations, edema, persistant/recurrent cough, hemoptysis, dyspnea (rest/exertional/paroxysmal nocturnal), gastrointestinal bleeding (melena, rectal bleeding), abdominal pain, significant heartburn, bowel changes, GU symptoms (dysuria, hematuria, incontinence), Gyn symptoms (abnormal  bleeding, pain),  syncope, focal weakness, memory loss, numbness & tingling, skin/hair/nail changes, abnormal bruising or bleeding, anxiety, or depression.     Objective:   Physical Exam General Appearance:    Alert, cooperative, no distress, appears stated age  Head:    Normocephalic, without obvious abnormality, atraumatic  Eyes:    PERRL, conjunctiva/corneas clear, EOM's intact, fundi    benign, both eyes  Ears:    Normal TM's and external ear canals, both ears  Nose:   Nares normal, septum midline, mucosa normal, no drainage    or sinus tenderness  Throat:   Lips, mucosa, and tongue normal; teeth and gums normal  Neck:   Supple, symmetrical, trachea midline, no adenopathy;    Thyroid: no enlargement/tenderness/nodules  Back:     Symmetric, no curvature, ROM normal, no CVA tenderness  Lungs:     Clear to auscultation bilaterally, respirations unlabored  Chest Wall:    No tenderness or deformity   Heart:    Regular rate and rhythm, S1 and S2 normal, no murmur, rub   or gallop  Breast Exam:    Deferred to GYN  Abdomen:     Soft, non-tender, bowel sounds active all four quadrants,    no masses, no organomegaly  Genitalia:    Deferred to GYN  Rectal:    Extremities:   Extremities normal, atraumatic, no cyanosis or edema  Pulses:   2+ and symmetric all extremities  Skin:   Skin color, texture, turgor  normal, no rashes or lesions  Lymph nodes:   Cervical, supraclavicular, and axillary nodes normal  Neurologic:   CNII-XII intact, normal strength, sensation and reflexes    throughout          Assessment & Plan:

## 2015-02-28 NOTE — Progress Notes (Signed)
Pre visit review using our clinic review tool, if applicable. No additional management support is needed unless otherwise documented below in the visit note. 

## 2015-03-01 LAB — BASIC METABOLIC PANEL
BUN: 11 mg/dL (ref 6–23)
CO2: 28 mEq/L (ref 19–32)
Calcium: 9.3 mg/dL (ref 8.4–10.5)
Chloride: 103 mEq/L (ref 96–112)
Creatinine, Ser: 0.66 mg/dL (ref 0.40–1.20)
GFR: 105.65 mL/min (ref >60.00)
Glucose, Bld: 69 mg/dL — ABNORMAL LOW (ref 70–99)
Potassium: 3.9 mEq/L (ref 3.5–5.1)
Sodium: 137 mEq/L (ref 135–145)

## 2015-03-01 LAB — LIPID PANEL
Cholesterol: 207 mg/dL — ABNORMAL HIGH (ref 0–200)
HDL: 64.7 mg/dL (ref >39.00)
LDL Cholesterol: 127 mg/dL — ABNORMAL HIGH (ref 0–99)
NonHDL: 142.3
Total CHOL/HDL Ratio: 3
Triglycerides: 75 mg/dL (ref 0.0–149.0)
VLDL: 15 mg/dL (ref 0.0–40.0)

## 2015-03-01 LAB — CBC WITH DIFFERENTIAL/PLATELET
Basophils Absolute: 0 10*3/uL (ref 0.0–0.1)
Basophils Relative: 0.5 % (ref 0.0–3.0)
Eosinophils Absolute: 0.1 10*3/uL (ref 0.0–0.7)
Eosinophils Relative: 1.1 % (ref 0.0–5.0)
HCT: 43 % (ref 36.0–46.0)
Hemoglobin: 14.4 g/dL (ref 12.0–15.0)
Lymphocytes Relative: 35.3 % (ref 12.0–46.0)
Lymphs Abs: 2.7 10*3/uL (ref 0.7–4.0)
MCHC: 33.6 g/dL (ref 30.0–36.0)
MCV: 84.1 fl (ref 78.0–100.0)
Monocytes Absolute: 0.5 10*3/uL (ref 0.1–1.0)
Monocytes Relative: 6.9 % (ref 3.0–12.0)
Neutro Abs: 4.2 10*3/uL (ref 1.4–7.7)
Neutrophils Relative %: 56.2 % (ref 43.0–77.0)
Platelets: 253 10*3/uL (ref 150.0–400.0)
RBC: 5.11 Mil/uL (ref 3.87–5.11)
RDW: 13 % (ref 11.5–15.5)
WBC: 7.5 10*3/uL (ref 4.0–10.5)

## 2015-03-01 LAB — TSH: TSH: 1.27 u[IU]/mL (ref 0.35–4.50)

## 2015-03-01 LAB — HEPATIC FUNCTION PANEL
ALT: 11 U/L (ref 0–35)
AST: 17 U/L (ref 0–37)
Albumin: 4.3 g/dL (ref 3.5–5.2)
Alkaline Phosphatase: 43 U/L (ref 39–117)
Bilirubin, Direct: 0.1 mg/dL (ref 0.0–0.3)
Total Bilirubin: 0.6 mg/dL (ref 0.2–1.2)
Total Protein: 7.5 g/dL (ref 6.0–8.3)

## 2015-03-01 LAB — VITAMIN D 25 HYDROXY (VIT D DEFICIENCY, FRACTURES): VITD: 37.33 ng/mL (ref 30.00–100.00)

## 2015-03-02 ENCOUNTER — Encounter: Payer: Self-pay | Admitting: General Practice

## 2015-04-17 ENCOUNTER — Other Ambulatory Visit: Payer: Self-pay | Admitting: Family Medicine

## 2015-04-19 NOTE — Telephone Encounter (Signed)
Med filled.  

## 2015-06-28 ENCOUNTER — Other Ambulatory Visit: Payer: Self-pay | Admitting: Family Medicine

## 2015-06-28 NOTE — Telephone Encounter (Signed)
Medication filled to pharmacy as requested.   

## 2015-10-25 ENCOUNTER — Other Ambulatory Visit: Payer: Self-pay | Admitting: Family Medicine

## 2015-10-26 NOTE — Telephone Encounter (Signed)
Medication filled to pharmacy as requested.   

## 2015-12-26 ENCOUNTER — Encounter: Payer: Self-pay | Admitting: Family Medicine

## 2015-12-26 ENCOUNTER — Ambulatory Visit (INDEPENDENT_AMBULATORY_CARE_PROVIDER_SITE_OTHER): Payer: BC Managed Care – PPO | Admitting: Family Medicine

## 2015-12-26 VITALS — BP 122/86 | HR 82 | Temp 97.9°F | Ht 68.0 in | Wt 186.0 lb

## 2015-12-26 DIAGNOSIS — F329 Major depressive disorder, single episode, unspecified: Secondary | ICD-10-CM

## 2015-12-26 DIAGNOSIS — F32A Depression, unspecified: Secondary | ICD-10-CM

## 2015-12-26 DIAGNOSIS — R5383 Other fatigue: Secondary | ICD-10-CM | POA: Insufficient documentation

## 2015-12-26 MED ORDER — ESCITALOPRAM OXALATE 20 MG PO TABS: 20.0000 mg | ORAL_TABLET | Freq: Every day | ORAL | Status: DC

## 2015-12-26 NOTE — Progress Notes (Signed)
Pre visit review using our clinic review tool, if applicable. No additional management support is needed unless otherwise documented below in the visit note. 

## 2015-12-26 NOTE — Progress Notes (Signed)
   Subjective:    Patient ID: [name] [name], female    DOB: 08/31/1976, 40 y.o.   MRN: [id number]  HPI Miserably tired- pt is concerned that she is 'more depressed than I realized'.  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  Pt admits to being very busy with kids' activities.  Increased irritability.  Pt has been on Lexapro for almost a year and pt feels like we have back slid somewhat.  Low motivation.   Review of Systems For ROS see HPI     Objective:   Physical Exam  Constitutional: She is oriented to person, place, and time. She appears well-developed and well-nourished. No distress.  HENT:  Head: Normocephalic and atraumatic.  Eyes: Conjunctivae and EOM are normal. Pupils are equal, round, and reactive to light.  Neck: Normal range of motion. Neck supple. No thyromegaly present.  Cardiovascular: Normal rate, regular rhythm, normal heart sounds and intact distal pulses.   Pulmonary/Chest: Effort normal and breath sounds normal. No respiratory distress. She has no wheezes. She has no rales.  Neurological: She is alert and oriented to person, place, and time.  Skin: Skin is warm and dry.  Psychiatric: She has a normal mood and affect. Her behavior is normal. Thought content normal.  Vitals reviewed.         Assessment & Plan:

## 2015-12-26 NOTE — Assessment & Plan Note (Signed)
Deteriorated.  Pt reports somewhat of a back slide on the Lexapro.  She is again tired, irritable.  Increase Lexapro to  daily as pt has been doing well and tolerating med w/o side effects.  Will follow closely for improvement.

## 2015-12-26 NOTE — Patient Instructions (Signed)
Follow up in 3-4 weeks to recheck fatigue and mood We'll notify you of your lab results and make any changes if needed Increase the Lexapro to - 2 of what you have at home and 1 of the new prescription Make sure you are taking 'you' time- this is very important! Call with any questions or concerns If you want to join [place] at the new [place] office, any scheduled appointments will automatically transfer and we will see you at [address] [place] [address] [place], [place], [place] [address] [facility]) Pablo Pena in there!!!

## 2015-12-26 NOTE — Assessment & Plan Note (Signed)
New.  Suspect this is related to pt's worsening depression but will check labs to r/o metabolic cause.  Will follow.

## 2015-12-27 LAB — CBC WITH DIFFERENTIAL/PLATELET
Basophils Absolute: 0.1 10*3/uL (ref 0.0–0.1)
Basophils Relative: 0.7 % (ref 0.0–3.0)
Eosinophils Absolute: 0.1 10*3/uL (ref 0.0–0.7)
Eosinophils Relative: 0.9 % (ref 0.0–5.0)
HCT: 45.2 % (ref 36.0–46.0)
Hemoglobin: 14.9 g/dL (ref 12.0–15.0)
Lymphocytes Relative: 30.7 % (ref 12.0–46.0)
Lymphs Abs: 2.8 10*3/uL (ref 0.7–4.0)
MCHC: 33 g/dL (ref 30.0–36.0)
MCV: 85.7 fl (ref 78.0–100.0)
Monocytes Absolute: 0.7 10*3/uL (ref 0.1–1.0)
Monocytes Relative: 7.2 % (ref 3.0–12.0)
Neutro Abs: 5.6 10*3/uL (ref 1.4–7.7)
Neutrophils Relative %: 60.5 % (ref 43.0–77.0)
Platelets: 248 10*3/uL (ref 150.0–400.0)
RBC: 5.27 Mil/uL — ABNORMAL HIGH (ref 3.87–5.11)
RDW: 12.4 % (ref 11.5–15.5)
WBC: 9.2 10*3/uL (ref 4.0–10.5)

## 2015-12-27 LAB — BASIC METABOLIC PANEL
BUN: 20 mg/dL (ref 6–23)
CO2: 32 mEq/L (ref 19–32)
Calcium: 9.3 mg/dL (ref 8.4–10.5)
Chloride: 102 mEq/L (ref 96–112)
Creatinine, Ser: 0.79 mg/dL (ref 0.40–1.20)
GFR: 85.5 mL/min (ref >60.00)
Glucose, Bld: 87 mg/dL (ref 70–99)
Potassium: 3.8 mEq/L (ref 3.5–5.1)
Sodium: 137 mEq/L (ref 135–145)

## 2015-12-27 LAB — VITAMIN D 25 HYDROXY (VIT D DEFICIENCY, FRACTURES): VITD: 32.1 ng/mL (ref 30.00–100.00)

## 2015-12-27 LAB — TSH: TSH: 0.85 u[IU]/mL (ref 0.35–4.50)

## 2015-12-29 ENCOUNTER — Encounter: Payer: Self-pay | Admitting: General Practice

## 2016-01-02 ENCOUNTER — Telehealth: Payer: Self-pay | Admitting: Family Medicine

## 2016-01-02 NOTE — Telephone Encounter (Signed)
Caller name: Self  Can be reached: [phone]  Reason for call: Results from labs taken on Monday 2/6

## 2016-01-02 NOTE — Telephone Encounter (Signed)
Called patient advised letter was mailed to her. Given results of labs per Dr. Beverely Low.

## 2016-01-18 ENCOUNTER — Encounter: Payer: Self-pay | Admitting: Family Medicine

## 2016-01-18 ENCOUNTER — Ambulatory Visit (INDEPENDENT_AMBULATORY_CARE_PROVIDER_SITE_OTHER): Payer: BC Managed Care – PPO | Admitting: Family Medicine

## 2016-01-18 VITALS — BP 120/81 | HR 72 | Temp 98.0°F | Resp 16 | Ht 68.0 in | Wt 188.0 lb

## 2016-01-18 DIAGNOSIS — G43909 Migraine, unspecified, not intractable, without status migrainosus: Secondary | ICD-10-CM

## 2016-01-18 DIAGNOSIS — F329 Major depressive disorder, single episode, unspecified: Secondary | ICD-10-CM

## 2016-01-18 DIAGNOSIS — F32A Depression, unspecified: Secondary | ICD-10-CM

## 2016-01-18 MED ORDER — ESCITALOPRAM OXALATE 10 MG PO TABS: 10.0000 mg | ORAL_TABLET | Freq: Every day | ORAL | Status: DC

## 2016-01-18 MED ORDER — BUTALBITAL-ASPIRIN-CAFFEINE 50-325-40 MG PO CAPS: 1.0000 | ORAL_CAPSULE | Freq: Four times a day (QID) | ORAL | Status: AC | PRN

## 2016-01-18 MED ORDER — BUPROPION HCL ER (XL) 150 MG PO TB24: 150.0000 mg | ORAL_TABLET | Freq: Every day | ORAL | Status: DC

## 2016-01-18 NOTE — Progress Notes (Signed)
Pre visit review using our clinic review tool, if applicable. No additional management support is needed unless otherwise documented below in the visit note. 

## 2016-01-18 NOTE — Progress Notes (Signed)
   Subjective:    Patient ID: [name] [name], female    DOB: December 13, 1975, 40 y.o.   MRN: [id number]  HPI Depression- ongoing issue.  Lexapro was increased to  at last visit.  Pt reports she is less irritable than before but 'always hungry'.  Remains sleepy.    Migraines- pt reports she needs refill on Butalbital for abortive tx  Review of Systems For ROS see HPI     Objective:   Physical Exam  Constitutional: She is oriented to person, place, and time. She appears well-developed and well-nourished. No distress.  HENT:  Head: Normocephalic and atraumatic.  Eyes: Conjunctivae and EOM are normal. Pupils are equal, round, and reactive to light.  Neurological: She is alert and oriented to person, place, and time.  Skin: Skin is warm and dry.  Psychiatric: She has a normal mood and affect. Her behavior is normal. Thought content normal.  Vitals reviewed.         Assessment & Plan:

## 2016-01-18 NOTE — Patient Instructions (Signed)
Follow up by phone or MyChart in 3-4 weeks Decrease the Lexapro back to  daily Add the Wellbutrin once daily (take in the AM) Monitor for any side effects and notify me if you have any Try and get regular exercise- this will actually improve your energy level! Use the Butalbital as needed for migraines Call with any questions or concerns Hang in there!

## 2016-01-18 NOTE — Assessment & Plan Note (Signed)
Ongoing issue for pt.  She reports irritability improved w/ increased dose of Lexapro but her hunger worsened- making her unhappy.  She continues to have excessive fatigue and low motivation.  Will decrease Lexapro back to  nightly and add Wellbutrin for increased energy.  Reviewed possible side effects w/ pt.  Will continue to follow closely.  Pt expressed understanding and is in agreement w/ plan.

## 2016-01-18 NOTE — Assessment & Plan Note (Signed)
New to provider, ongoing for pt.  She reports needing refill of Butalbital for abortive tx.  Script provided.

## 2016-02-17 ENCOUNTER — Ambulatory Visit (INDEPENDENT_AMBULATORY_CARE_PROVIDER_SITE_OTHER): Payer: BC Managed Care – PPO | Admitting: Family Medicine

## 2016-02-17 ENCOUNTER — Encounter: Payer: Self-pay | Admitting: Family Medicine

## 2016-02-17 VITALS — BP 116/80 | HR 83 | Temp 98.5°F | Ht 68.0 in | Wt 187.8 lb

## 2016-02-17 DIAGNOSIS — J0100 Acute maxillary sinusitis, unspecified: Secondary | ICD-10-CM | POA: Insufficient documentation

## 2016-02-17 MED ORDER — AMOXICILLIN 875 MG PO TABS: 875.0000 mg | ORAL_TABLET | Freq: Two times a day (BID) | ORAL | Status: DC

## 2016-02-17 NOTE — Assessment & Plan Note (Signed)
Pt's sxs and PE consistent w/ infxn.  Start Amoxicillin (pt has taken previously w/o difficulty).  Start Zyrtec for allergy component and recurring hives.  Reviewed supportive care and red flags that should prompt return. Pt expressed understanding and is in agreement w/ plan.

## 2016-02-17 NOTE — Progress Notes (Signed)
   Subjective:    Patient ID: [name] [name], female    DOB: 09-04-1976, 40 y.o.   MRN: [id number]  HPI Sinus infxn- [facility] and started on Omnicef for infxn.  Developed hives on trunk after taking medication.  Went back to [facility] and was given Prednisone and Zyrtec but she has not taken either.  + cough- productive of yellow sputum.  + HA.  Sinus pain/pressure.  sxs initially started 3 weeks ago.  No sore throat, was negative for flu on Monday, no ear pain/pressure, no CP, SOB, fever.  Some improvement in sxs w/ Aleve-D.   Review of Systems For ROS see HPI     Objective:   Physical Exam  Constitutional: She is oriented to person, place, and time. She appears well-developed and well-nourished. No distress.  HENT:  Head: Normocephalic and atraumatic.  Right Ear: Tympanic membrane normal.  Left Ear: Tympanic membrane normal.  Nose: Mucosal edema and rhinorrhea present. Right sinus exhibits maxillary sinus tenderness and frontal sinus tenderness. Left sinus exhibits maxillary sinus tenderness and frontal sinus tenderness.  Mouth/Throat: Uvula is midline and mucous membranes are normal. Posterior oropharyngeal erythema present. No oropharyngeal exudate.  Eyes: Conjunctivae and EOM are normal. Pupils are equal, round, and reactive to light.  Neck: Normal range of motion. Neck supple.  Cardiovascular: Normal rate, regular rhythm and normal heart sounds.   Pulmonary/Chest: Effort normal and breath sounds normal. No respiratory distress. She has no wheezes.  Lymphadenopathy:    She has no cervical adenopathy.  Neurological: She is alert and oriented to person, place, and time.  Skin: Skin is warm and dry.  Psychiatric: She has a normal mood and affect. Her behavior is normal. Thought content normal.  Vitals reviewed.         Assessment & Plan:

## 2016-02-17 NOTE — Patient Instructions (Signed)
Follow up as needed Start the Amoxicillin twice daily- take w/ food Drink plenty of fluids DO NOT take the Prednisone Take the Cetirizine daily for the allergy component Call with any questions or concerns Hang in there!!!

## 2016-02-21 ENCOUNTER — Other Ambulatory Visit: Payer: Self-pay

## 2016-02-21 DIAGNOSIS — Z1231 Encounter for screening mammogram for malignant neoplasm of breast: Secondary | ICD-10-CM

## 2016-03-26 ENCOUNTER — Ambulatory Visit
Admission: RE | Admit: 2016-03-26 | Discharge: 2016-03-26 | Disposition: A | Discharge status: Home / Self Care | Payer: BC Managed Care – PPO | Source: Ambulatory Visit

## 2016-03-26 DIAGNOSIS — Z1231 Encounter for screening mammogram for malignant neoplasm of breast: Secondary | ICD-10-CM

## 2016-08-01 LAB — HM PAP SMEAR: HM Pap smear: NORMAL

## 2016-08-07 ENCOUNTER — Other Ambulatory Visit: Payer: Self-pay | Admitting: General Practice

## 2016-08-14 ENCOUNTER — Encounter: Payer: Self-pay | Admitting: General Practice

## 2016-09-08 ENCOUNTER — Other Ambulatory Visit: Payer: Self-pay | Admitting: Family Medicine

## 2016-11-20 ENCOUNTER — Encounter: Payer: Self-pay | Admitting: Family Medicine

## 2016-11-20 ENCOUNTER — Telehealth: Payer: Self-pay | Admitting: Family Medicine

## 2016-11-20 ENCOUNTER — Ambulatory Visit (INDEPENDENT_AMBULATORY_CARE_PROVIDER_SITE_OTHER): Payer: BC Managed Care – PPO | Admitting: Family Medicine

## 2016-11-20 VITALS — BP 130/88 | HR 76 | Temp 98.3°F | Resp 16 | Ht 68.0 in | Wt 191.4 lb

## 2016-11-20 DIAGNOSIS — J0140 Acute pansinusitis, unspecified: Secondary | ICD-10-CM | POA: Diagnosis not present

## 2016-11-20 DIAGNOSIS — R05 Cough: Secondary | ICD-10-CM | POA: Diagnosis not present

## 2016-11-20 DIAGNOSIS — R059 Cough, unspecified: Secondary | ICD-10-CM

## 2016-11-20 MED ORDER — FLUTICASONE PROPIONATE 50 MCG/ACT NA SUSP: 2.0000 | Freq: Every day | NASAL | 6 refills | Status: DC

## 2016-11-20 MED ORDER — AMOXICILLIN-POT CLAVULANATE 875-125 MG PO TABS: 1.0000 | ORAL_TABLET | Freq: Two times a day (BID) | ORAL | 0 refills | Status: DC

## 2016-11-20 MED ORDER — PROMETHAZINE-DM 6.25-15 MG/5ML PO SYRP: 5.0000 mL | ORAL_SOLUTION | Freq: Four times a day (QID) | ORAL | 0 refills | Status: DC | PRN

## 2016-11-20 NOTE — Telephone Encounter (Signed)
Ok to switch 

## 2016-11-20 NOTE — Telephone Encounter (Signed)
ok 

## 2016-11-20 NOTE — Telephone Encounter (Signed)
Pt would like to switch from Dr. Beverely Lowabori to Dr Patsy Lageropland.   Is this switch okay?

## 2016-11-20 NOTE — Progress Notes (Signed)
Pre visit review using our clinic review tool, if applicable. No additional management support is needed unless otherwise documented below in the visit note. 

## 2016-11-20 NOTE — Progress Notes (Signed)
Patient ID: [name] PickingCynthia [name], female    DOB: [birth year]  Age: 41 y.o. MRN: [id number]    Subjective:  Subjective  HPI [name] PickingCynthia [name] presents for sore throat , cough, no fever, + sinus pressure/ headaches and body aches.  X 3 weeks and on dec 18 she had surgery and was on 5 days amoxil which helped but then symptoms came back.  neti pot helped.     Review of Systems  Constitutional: Negative for chills and fever.  HENT: Positive for congestion, postnasal drip, rhinorrhea, sinus pain and sinus pressure.   Respiratory: Positive for cough. Negative for chest tightness, shortness of breath and wheezing.   Cardiovascular: Negative for chest pain, palpitations and leg swelling.  Allergic/Immunologic: Negative for environmental allergies.    History Past Medical History:  Diagnosis Date  . Anxiety   . Asthma    childhood  . Depression   . Diverticulitis   . HSV-2 infection   . Hypertension    white coat  . Migraine     She has a past surgical history that includes Appendectomy and LEEP.   Her family history includes Cancer in her mother; Crohn's disease in her father; Depression in her brother and sister; Hypertension in her mother.She reports that she has never smoked. She does not have any smokeless tobacco history on file. She reports that she does not drink alcohol or use drugs.  Current Outpatient Prescriptions on File Prior to Visit  Medication Sig Dispense Refill  . buPROPion (WELLBUTRIN XL) 150 MG 24 hr tablet Take 1 tablet (150 mg total) by mouth daily. 30 tablet 6  . butalbital-aspirin-caffeine (FIORINAL) 50-325-40 MG capsule Take 1 capsule by mouth every 6 (six) hours as needed for headache. 30 capsule 0   No current facility-administered medications on file prior to visit.      Objective:  Objective  Physical Exam  Constitutional: She is oriented to person, place, and time. She appears well-developed and well-nourished.  HENT:  Right Ear: External ear normal.  Left  Ear: External ear normal.  Nose: Right sinus exhibits maxillary sinus tenderness and frontal sinus tenderness. Left sinus exhibits frontal sinus tenderness.  + PND + errythema  Eyes: Conjunctivae are normal. Right eye exhibits no discharge. Left eye exhibits no discharge.  Cardiovascular: Normal rate, regular rhythm and normal heart sounds.   No murmur heard. Pulmonary/Chest: Effort normal and breath sounds normal. No respiratory distress. She has no wheezes. She has no rales. She exhibits no tenderness.  Musculoskeletal: She exhibits no edema.  Lymphadenopathy:    She has cervical adenopathy.  Neurological: She is alert and oriented to person, place, and time.   BP 130/88 (BP Location: Right Arm, Cuff Size: Normal)   Pulse 76   Temp 98.3 F (36.8 C) (Oral)   Resp 16   Ht 5\' 8"  (1.727 m)   Wt 191 lb 6.4 oz (86.8 kg)   SpO2 98%   BMI 29.10 kg/m  Wt Readings from Last 3 Encounters:  11/20/16 191 lb 6.4 oz (86.8 kg)  02/17/16 187 lb 12.8 oz (85.2 kg)  01/18/16 188 lb (85.3 kg)     Lab Results  Component Value Date   WBC 9.2 12/26/2015   HGB 14.9 12/26/2015   HCT 45.2 12/26/2015   PLT 248.0 12/26/2015   GLUCOSE 87 12/26/2015   CHOL 207 (H) 02/28/2015   TRIG 75.0 02/28/2015   HDL 64.70 02/28/2015   LDLCALC 127 (H) 02/28/2015   ALT 11 02/28/2015   AST 17  02/28/2015   NA 137 12/26/2015   K 3.8 12/26/2015   CL 102 12/26/2015   CREATININE 0.79 12/26/2015   BUN 20 12/26/2015   CO2 32 12/26/2015   TSH 0.85 12/26/2015    Mm Digital Screening Bilateral  Result Date: 03/27/2016 CLINICAL DATA:  Screening. EXAM: DIGITAL SCREENING BILATERAL MAMMOGRAM WITH CAD COMPARISON:  None. ACR Breast Density Category d: The breast tissue is extremely dense, which lowers the sensitivity of mammography. FINDINGS: There are no findings suspicious for malignancy. Images were processed with CAD. IMPRESSION: No mammographic evidence of malignancy. A result letter of this screening mammogram will be  mailed directly to the patient. RECOMMENDATION: Screening mammogram in one year. (Code:SM-B-01Y) BI-RADS CATEGORY  1: Negative. Electronically Signed   By: Frederico Hamman M.D.   On: 03/27/2016 08:07     Assessment & Plan:  Plan  I have discontinued Ms. Haaland's IUD'S IU, omeprazole, amoxicillin, and escitalopram. I am also having her start on amoxicillin-clavulanate, fluticasone, and promethazine-dextromethorphan. Additionally, I am having her maintain her butalbital-aspirin-caffeine, buPROPion, and levonorgestrel.  Meds ordered this encounter  Medications  . levonorgestrel (MIRENA) 20 MCG/24HR IUD    Sig: 1 each by Intrauterine route once.  Marland Kitchen amoxicillin-clavulanate (AUGMENTIN) 875-125 MG tablet    Sig: Take 1 tablet by mouth 2 (two) times daily.    Dispense:  20 tablet    Refill:  0  . fluticasone (FLONASE) 50 MCG/ACT nasal spray    Sig: Place 2 sprays into both nostrils daily.    Dispense:  16 g    Refill:  6  . promethazine-dextromethorphan (PROMETHAZINE-DM) 6.25-15 MG/5ML syrup    Sig: Take 5 mLs by mouth 4 (four) times daily as needed.    Dispense:  118 mL    Refill:  0    Problem List Items Addressed This Visit    None    Visit Diagnoses    Acute pansinusitis, recurrence not specified    -  Primary   Relevant Medications   amoxicillin-clavulanate (AUGMENTIN) 875-125 MG tablet   fluticasone (FLONASE) 50 MCG/ACT nasal spray   promethazine-dextromethorphan (PROMETHAZINE-DM) 6.25-15 MG/5ML syrup   Cough       Relevant Medications   promethazine-dextromethorphan (PROMETHAZINE-DM) 6.25-15 MG/5ML syrup      Follow-up: Return if symptoms worsen or fail to improve.  Donato Schultz, DO

## 2016-11-20 NOTE — Patient Instructions (Signed)

## 2016-11-22 NOTE — Telephone Encounter (Signed)
Pt has been scheduled.  °

## 2016-12-27 ENCOUNTER — Ambulatory Visit: Payer: BC Managed Care – PPO | Admitting: Family Medicine

## 2017-01-25 ENCOUNTER — Other Ambulatory Visit: Payer: Self-pay | Admitting: Family Medicine

## 2017-02-04 ENCOUNTER — Other Ambulatory Visit: Payer: Self-pay | Admitting: Family Medicine

## 2017-02-04 NOTE — Telephone Encounter (Signed)
Please ask pt where she has been getting medication since according to our records she has been out since Thanksgiving

## 2017-02-04 NOTE — Telephone Encounter (Signed)
Patient requesting refill of Escitalopram 10mg .  States she is out of meds and does not have appt to establish with new pcp(Dr. Copland) until 02/14/17.  [place] office transferred patient to pcp office to request refill.  Pharmacy:  [facility] [id number] [place] [address] [phone] (Phone) [phone] (Fax)

## 2017-02-04 NOTE — Telephone Encounter (Signed)
Pt seen 02/17/16 for Sinus, ok to fill until she sees new provider? escitalopram is not listed on her current med list  Last filled 09/10/16 #30 with 0

## 2017-02-05 NOTE — Telephone Encounter (Signed)
Patient states that she had a refill in November and she was given 90 days (instead of 30).  [facility] and it was the same RX that Dr. Beverely Lowabori had done.

## 2017-02-05 NOTE — Telephone Encounter (Signed)
Called and LMOVM for pt to return call. Need to know where her last refill was from

## 2017-02-06 MED ORDER — ESCITALOPRAM OXALATE 10 MG PO TABS: ORAL_TABLET | ORAL | 0 refills | Status: DC

## 2017-02-14 ENCOUNTER — Ambulatory Visit (INDEPENDENT_AMBULATORY_CARE_PROVIDER_SITE_OTHER): Payer: BC Managed Care – PPO | Admitting: Family Medicine

## 2017-02-14 ENCOUNTER — Encounter: Payer: Self-pay | Admitting: Family Medicine

## 2017-02-14 VITALS — BP 124/76 | HR 60 | Ht 68.0 in | Wt 193.4 lb

## 2017-02-14 DIAGNOSIS — G478 Other sleep disorders: Secondary | ICD-10-CM | POA: Diagnosis not present

## 2017-02-14 DIAGNOSIS — F3289 Other specified depressive episodes: Secondary | ICD-10-CM

## 2017-02-14 MED ORDER — ESCITALOPRAM OXALATE 10 MG PO TABS: ORAL_TABLET | ORAL | 3 refills | Status: DC

## 2017-02-14 NOTE — Patient Instructions (Signed)
It was very nice to meet you today- take care and we will set you up for a sleep evaluation when you get back from your trip.  Let me know if you have any other concerns.  We are also happy to do labs for you in the next few months if you like.  Take care!

## 2017-02-14 NOTE — Progress Notes (Signed)
[facility] [address], [address] [place], [place] [address] [phone]  Date:  02/14/2017   Name:  [name] [name]   DOB:  1976-06-30   MRN:  [id number]  PCP:  Abbe Amsterdam, MD    Chief Complaint: Transfer of Care (Former pt of Dr. Beverely Low. )   History of Present Illness:  [name] [name] is a 41 y.o. very pleasant female patient who presents with the following:  First visit with myself today- pt has been seeing Dr. Beverely Low who has moved to a new location.  She has a history of migraine HA, depression and fatigue.  Last year Dr. Beverely Low had adjusted her lexapro as her depression sx were worsening.  She feels like this is really working well for her and that her depression is under good control She also uses butalbital prn for migraine ha- she feels that her HA are much better, she gets 0-1 per month  She has an IUD and does have menses, but does not bleed much at all She has never been a smoker  She is a middle Geneticist, molecular.  Work is busy but not overly stressful  She sees GYN for her pap smear  She has felt quite tired- she notes that she sleeps really deeply, but just still feels tired. She is noted to snore per her husband- this is pretty new. She will feel sleepy during the day, like she could go to sleep at any time.  She feels like she is still tired when she wakes up in the am Her energy is low  She had a pelvic sling in December- she is now able to exercise more easily but she feels too tired Her daughters are 26yo and 13 yo- they are in good health  Patient Active Problem List   Diagnosis Date Noted  . Migraine 01/18/2016  . Stress incontinence 08/06/2014  . Depression 08/06/2014  . Other malaise and fatigue 08/06/2014  . GERD (gastroesophageal reflux disease) 08/06/2014  . Seasonal allergies 08/06/2014  . Overweight (BMI 25.0-29.9) 08/06/2014    Past Medical History:  Diagnosis Date  . Anxiety    . Asthma    childhood  . Depression   . Diverticulitis   . HSV-2 infection   . Hypertension    white coat  . Migraine     Past Surgical History:  Procedure Laterality Date  . APPENDECTOMY    . LEEP      Social History  Substance Use Topics  . Smoking status: Never Smoker  . Smokeless tobacco: Never Used  . Alcohol use No    Family History  Problem Relation Age of Onset  . Hypertension Mother   . Cancer Mother   . Crohn's disease Father   . Depression Sister   . Depression Brother     Allergies  Allergen Reactions  . Phenergan [Promethazine Hcl] Other (See Comments)    Pt advised that she was given a nausea medication when she was 17 caused her to like she could not move. Unsure if this was the medication.   Truman Hayward [Cefdinir] Rash    Medication list has been reviewed and updated.  Current Outpatient Prescriptions on File Prior to Visit  Medication Sig Dispense Refill  . butalbital-aspirin-caffeine (FIORINAL) 50-325-40 MG capsule Take 1 capsule by mouth every 6 (six) hours as needed for headache. 30 capsule 0  . levonorgestrel (MIRENA) 20 MCG/24HR IUD 1 each by Intrauterine route once.  No current facility-administered medications on file prior to visit.     Review of Systems:  As per HPI- otherwise negative.   Physical Examination: Vitals:   02/14/17 1621  BP: 124/76  Pulse: 60   Vitals:   02/14/17 1621  Weight: 193 lb 6.4 oz (87.7 kg)  Height: 5\' 8"  (1.727 m)   Body mass index is 29.41 kg/m. Ideal Body Weight: Weight in (lb) to have BMI = 25: 164.1  GEN: WDWN, NAD, Non-toxic, A & O x 3, overweight, otherwise looks well HEENT: Atraumatic, Normocephalic. Neck supple. No masses, No LAD.  Bilateral TM wnl, oropharynx normal.  PEERL,EOMI.  She has a small posterior oropharynx  Ears and Nose: No external deformity. CV: RRR, No M/G/R. No JVD. No thrill. No extra heart sounds. PULM: CTA B, no wheezes, crackles, rhonchi. No retractions. No resp.  distress. No accessory muscle use. EXTR: No c/c/e NEURO Normal gait.  PSYCH: Normally interactive. Conversant. Not depressed or anxious appearing.  Calm demeanor.    Assessment and Plan: Other depression - Plan: escitalopram (LEXAPRO) 10 MG tablet  Non-restorative sleep - Plan: Ambulatory referral to Neurology  Here today for a visit- she needs a refill of her lexapro which is working well for her She notes difficulty losing weight and fatigue/ somnolence.  Will set her up for a sleep study as we wonder if she might have OSA.  She will let me know if she does not hear about this appt Declines BW today as she needs to get to her 9yo daughter's b-day party. Will do at a later date for her   Signed Abbe AmsterdamJessica Hazeline Charnley, MD

## 2017-04-10 ENCOUNTER — Ambulatory Visit (INDEPENDENT_AMBULATORY_CARE_PROVIDER_SITE_OTHER): Payer: BC Managed Care – PPO | Admitting: Neurology

## 2017-04-10 ENCOUNTER — Encounter (INDEPENDENT_AMBULATORY_CARE_PROVIDER_SITE_OTHER): Payer: Self-pay

## 2017-04-10 ENCOUNTER — Encounter: Payer: Self-pay | Admitting: Neurology

## 2017-04-10 VITALS — BP 130/88 | HR 78 | Resp 16 | Ht 68.0 in | Wt 192.0 lb

## 2017-04-10 DIAGNOSIS — R0683 Snoring: Secondary | ICD-10-CM | POA: Diagnosis not present

## 2017-04-10 DIAGNOSIS — G4761 Periodic limb movement disorder: Secondary | ICD-10-CM | POA: Diagnosis not present

## 2017-04-10 DIAGNOSIS — R519 Headache, unspecified: Secondary | ICD-10-CM

## 2017-04-10 DIAGNOSIS — G2581 Restless legs syndrome: Secondary | ICD-10-CM

## 2017-04-10 DIAGNOSIS — G4719 Other hypersomnia: Secondary | ICD-10-CM

## 2017-04-10 DIAGNOSIS — E663 Overweight: Secondary | ICD-10-CM

## 2017-04-10 DIAGNOSIS — Z820 Family history of epilepsy and other diseases of the nervous system: Secondary | ICD-10-CM

## 2017-04-10 DIAGNOSIS — R51 Headache: Secondary | ICD-10-CM

## 2017-04-10 NOTE — Patient Instructions (Signed)

## 2017-04-10 NOTE — Progress Notes (Signed)
Subjective:    Patient ID: [name] [name] is a 41 y.o. female.  HPI     Huston FoleySaima Trygve Thal, MD, PhD [facility] [address], [address] [address] [place], [place] [address]  Dear Dr. Patsy Lageropland,   I saw your patient, [name] [name], upon your kind request, in my neurologic clinic today for initial consultation of her sleep disorder, in particular, concern for underlying obstructive sleep apnea. The patient is unaccompanied today. As you know, [name] [name] is a 41 year old right-handed woman with an underlying medical history of depression, stress incontinence, status post pelvic sling in December 2017, reflux disease, seasonal allergies, migraine headaches, anxiety, childhood asthma, diverticulitis, and overweight state, who reports snoring and excessive daytime somnolence. I reviewed your office note from 02/14/2017.  Her Epworth sleepiness score is 13 out of 24 today, fatigue score is 44 out of 63. Her sleep is interrupted, she reports weight gain and worsening snoring. She lives at home with her husband and 2 children. She works for [employer]. She quit smoking, she smoked socially while in college. She drinks alcohol occasionally, once or twice a week, caffeine in the form of coffee, 2 cups daily. She works as a Public relations account executiveguidance counselor. She has a long-standing history of migraine headaches, both her daughters have migraine headaches, ages 1512 and 289. She reports a bedtime of around 10 PM, wakeup time typically around 5:30 AM to 6 AM. She doesn't typically wake up rested. She is tired during the day. She is not motivated to exercise, she has gained weight in the past couple of years. Her snoring has emerged in the past year or so. She has a family history of sleep apnea in her older brother. He passed away at the age of 42. She is the youngest of a total of 5 children. She endorses restless leg symptoms and the need to rub her feet against each other at night, she is a restless  sleeper. She has been told by her husband that she moves her feet and her sleep. She has woken up with a headache. She does not have night to night nocturia.  Her Past Medical History Is Significant For: Past Medical History:  Diagnosis Date  . Anxiety   . Asthma    childhood  . Depression   . Diverticulitis   . HSV-2 infection   . Hypertension    white coat  . Migraine     Her Past Surgical History Is Significant For: Past Surgical History:  Procedure Laterality Date  . APPENDECTOMY    . INSERTION OF MESH     2017 bladder  . LEEP      Her Family History Is Significant For: Family History  Problem Relation Age of Onset  . Hypertension Mother   . Cancer Mother   . Crohn's disease Father   . Depression Sister   . Depression Brother     Her Social History Is Significant For: Social History   Social History  . Marital status: Married    Spouse name: N/A  . Number of children: 2  . Years of education: masters   Social History Main Topics  . Smoking status: Never Smoker  . Smokeless tobacco: Never Used  . Alcohol use Yes  . Drug use: No  . Sexual activity: Not Asked   Other Topics Concern  . None   Social History Narrative   Drinks 2 cups of coffee a day     Her Allergies Are:  Allergies  Allergen Reactions  . Phenergan [Promethazine Hcl] Other (See Comments)    Pt advised that she was given a nausea medication when she was 17 caused her to like she could not move. Unsure if this was the medication.   Truman Hayward [Cefdinir] Rash  :   Her Current Medications Are:  Outpatient Encounter Prescriptions as of 04/10/2017  Medication Sig  . butalbital-aspirin-caffeine (FIORINAL) 50-325-40 MG capsule Take 1 capsule by mouth every 6 (six) hours as needed for headache.  . escitalopram (LEXAPRO) 10 MG tablet TAKE 1 TABLET(10 MG) BY MOUTH DAILY  . levonorgestrel (MIRENA) 20 MCG/24HR IUD 1 each by Intrauterine route once.   No facility-administered encounter  medications on file as of 04/10/2017.   :  Review of Systems:  Out of a complete 14 point review of systems, all are reviewed and negative with the exception of these symptoms as listed below: Review of Systems  Neurological:       Patient states that she is a restless sleeper, snores, wakes up feeling tired, morning headaches, daytime fatigue, may take a nap on the weekend.   Epworth Sleepiness Scale 0= would never doze 1= slight chance of dozing 2= moderate chance of dozing 3= high chance of dozing  Sitting and reading:3 Watching TV:3 Sitting inactive in a public place (ex. Theater or meeting):1 As a passenger in a car for an hour without a break:2 Lying down to rest in the afternoon:2 Sitting and talking to someone:0 Sitting quietly after lunch (no alcohol):1 In a car, while stopped in traffic:1 Total:13   Objective:  Neurologic Exam  Physical Exam Physical Examination:   Vitals:   04/10/17 1623  BP: 130/88  Pulse: 78  Resp: 16   General Examination: The patient is a very pleasant 41 y.o. female in no acute distress. She appears well-developed and well-nourished and well groomed.   HEENT: Normocephalic, atraumatic, pupils are equal, round and reactive to light and accommodation. Funduscopic exam is normal with sharp disc margins noted. Extraocular tracking is good without limitation to gaze excursion or nystagmus noted. Normal smooth pursuit is noted. Hearing is grossly intact. Tympanic membranes are clear bilaterally. Face is symmetric with normal facial animation and normal facial sensation. Speech is clear with no dysarthria noted. There is no hypophonia. There is no lip, neck/head, jaw or voice tremor. Neck is supple with full range of passive and active motion. There are no carotid bruits on auscultation. Oropharynx exam reveals: mild mouth dryness, adequate dental hygiene and mild airway crowding, due to redundant soft palate, floppy uvula, tonsils in place, about 1+.  Mallampati is class I. Tongue protrudes centrally and palate elevates symmetrically. Neck size is 13 7/8 inches. She has a Mild overbite. Nasal inspection reveals no significant nasal mucosal bogginess or redness, but septal deviation to L.   Chest: Clear to auscultation without wheezing, rhonchi or crackles noted.  Heart: S1+S2+0, regular and normal without murmurs, rubs or gallops noted.   Abdomen: Soft, non-tender and non-distended with normal bowel sounds appreciated on auscultation.  Extremities: There is no pitting edema in the distal lower extremities bilaterally. Pedal pulses are intact.  Skin: Warm and dry without trophic changes noted.   Musculoskeletal: exam reveals no obvious joint deformities, tenderness or joint swelling or erythema.   Neurologically:  Mental status: The patient is awake, alert and oriented in all 4 spheres. Her immediate and remote memory, attention, language skills and fund of knowledge are appropriate. There is no evidence of aphasia, agnosia, apraxia or  anomia. Speech is clear with normal prosody and enunciation. Thought process is linear. Mood is normal and affect is normal.  Cranial nerves II - XII are as described above under HEENT exam. In addition: shoulder shrug is normal with equal shoulder height noted. Motor exam: Normal bulk, strength and tone is noted. There is no drift, tremor or rebound. Romberg is negative. Reflexes are 2+ throughout. Babinski: Toes are flexor bilaterally. Fine motor skills and coordination: intact with normal finger taps, normal hand movements, normal rapid alternating patting, normal foot taps and normal foot agility.  Cerebellar testing: No dysmetria or intention tremor on finger to nose testing. Heel to shin is unremarkable bilaterally. There is no truncal or gait ataxia.  Sensory exam: intact to light touch, vibration, temperature sense in the upper and lower extremities.  Gait, station and balance: She stands easily. No  veering to one side is noted. No leaning to one side is noted. Posture is age-appropriate and stance is narrow based. Gait shows normal stride length and normal pace. No problems turning are noted. Tandem walk is unremarkable.   Assessment and Plan:  In summary, [name] [name] is a very pleasant 41 y.o.-year old female  with an underlying medical history of depression, stress incontinence, status post pelvic sling in December 2017, reflux disease, seasonal allergies, migraine headaches, anxiety, childhood asthma, diverticulitis, and overweight state, whose history and physical exam are concerning for obstructive sleep apnea (OSA). In addition, she endorses symptoms of restless leg syndrome and leg movements at night, in keeping with PLMs. I had a long chat with the patient about my findings and the diagnosis of OSA, its prognosis and treatment options. We talked about medical treatments, surgical interventions and non-pharmacological approaches. I explained in particular the risks and ramifications of untreated moderate to severe OSA, especially with respect to developing cardiovascular disease down the Road, including congestive heart failure, difficult to treat hypertension, cardiac arrhythmias, or stroke. Even type 2 diabetes has, in part, been linked to untreated OSA. Symptoms of untreated OSA include daytime sleepiness, memory problems, mood irritability and mood disorder such as depression and anxiety, lack of energy, as well as recurrent headaches, especially morning headaches. We talked about trying to maintain a healthy lifestyle in general, as well as the importance of weight control. I encouraged the patient to eat healthy, exercise daily and keep well hydrated, to keep a scheduled bedtime and wake time routine, to not skip any meals and eat healthy snacks in between meals. I advised the patient not to drive when feeling sleepy. I recommended the following at this time: sleep study with potential  positive airway pressure titration. (We will score hypopneas at 3%).   I explained the sleep test procedure to the patient and also outlined possible surgical and non-surgical treatment options of OSA, including the use of a custom-made dental device (which would require a referral to a specialist dentist or oral surgeon), upper airway surgical options, such as pillar implants, radiofrequency surgery, tongue base surgery, and UPPP (which would involve a referral to an ENT surgeon). Rarely, jaw surgery such as mandibular advancement may be considered.  I also explained the CPAP treatment option to the patient, who indicated that she would be willing to try CPAP if the need arises.  I answered all her questions today and the patient was in agreement. I would like to see her back after the sleep study is completed and encouraged her to call with any interim questions, concerns, problems or updates.  Thank you very much for allowing me to participate in the care of this nice patient. If I can be of any further assistance to you please do not hesitate to call me at [phone].  Sincerely,   Star Age, MD, PhD

## 2017-05-08 ENCOUNTER — Ambulatory Visit (INDEPENDENT_AMBULATORY_CARE_PROVIDER_SITE_OTHER): Payer: BC Managed Care – PPO | Admitting: Neurology

## 2017-05-08 DIAGNOSIS — G4761 Periodic limb movement disorder: Secondary | ICD-10-CM | POA: Diagnosis not present

## 2017-05-08 DIAGNOSIS — R0683 Snoring: Secondary | ICD-10-CM

## 2017-05-08 DIAGNOSIS — G4720 Circadian rhythm sleep disorder, unspecified type: Secondary | ICD-10-CM

## 2017-05-08 DIAGNOSIS — R9431 Abnormal electrocardiogram [ECG] [EKG]: Secondary | ICD-10-CM

## 2017-05-10 NOTE — Progress Notes (Signed)
Patient referred by Dr. Patsy Lageropland, seen by me on 04/10/17, diagnostic PSG on 05/08/17.   Please call and notify the patient that the recent sleep study did not show any significant obstructive sleep apnea with the exception of mild to moderate snoring and borderline OSA when in REM sleep. For this, CPAP therapy is not warranted; weight loss may help. For disturbing snoring, an oral appliance (through a qualified dentist) can be considered. Occ PVCs were seen on single lead EKG; she can d/w PCP if consultation with cardiology may be feasible. Please inform patient that we can go over the details of the study during a follow up appointment or FU as needed.  Once you have spoken to patient, you can close this encounter.   Thanks,  Huston FoleySaima Deshone Lyssy, MD, PhD [facility])

## 2017-05-10 NOTE — Procedures (Signed)
PATIENT'S NAME:  [name] [name], Deeann DOB:      12-24-1975      MR#:    [id number]     DATE OF RECORDING: 05/08/2017 REFERRING M.D.:  Abbe AmsterdamJessica Copland MD Study Performed:   Baseline Polysomnogram HISTORY:  41 year old right-handed woman with an underlying medical history of depression, stress incontinence, status post pelvic sling in December 2017, reflux disease, seasonal allergies, migraine headaches, anxiety, childhood asthma, diverticulitis, aand overweight state, who reports snoring and excessive daytime somnolence. The patient endorsed the Epworth Sleepiness Scale at 13 points. The patient's weight 192 pounds with a height of 68 (inches), resulting in a BMI of 29.1 kg/m2. The patient's neck circumference measured 14 inches.  CURRENT MEDICATIONS: Fiorinal, Lexapro, Mirena   PROCEDURE:  This is a multichannel digital polysomnogram utilizing the Somnostar 11.2 system.  Electrodes and sensors were applied and monitored per AASM Specifications.   EEG, EOG, Chin and Limb EMG, were sampled at 200 Hz.  ECG, Snore and Nasal Pressure, Thermal Airflow, Respiratory Effort, CPAP Flow and Pressure, Oximetry was sampled at 50 Hz. Digital video and audio were recorded.      BASELINE STUDY  Lights Out was at 22:31 and Lights On at 05:11.  Total recording time (TRT) was 400.5 minutes, with a total sleep time (TST) of  329.5 minutes.   The patient's sleep latency was 37.5 minutes, which is delayed.  REM latency was 137.5 minutes, which is delayed. The sleep efficiency was 82.3 %.     SLEEP ARCHITECTURE: WASO (Wake after sleep onset) was 33 minutes with mild to moderate sleep fragmentation noted.  There were 21 minutes in Stage N1, 146.5 minutes Stage N2, 96 minutes Stage N3 and 66 minutes in Stage REM.  The percentage of Stage N1 was 6.4%, Stage N2 was 44.5%, which is normal, Stage N3 was 29.1%, which is mildly increased, and Stage R (REM sleep) was 20.%, which is normal.  The arousals were noted as: 14 were spontaneous,  5 were associated with PLMs, 13 were associated with respiratory events.    Audio and video analysis did not show any abnormal or unusual movements, behaviors, phonations or vocalizations.  The patient took 1 bathroom break. Mild to moderate snoring was noted.  EKG was in keeping with normal sinus rhythm (NSR) with occasional PVCs noted.  RESPIRATORY ANALYSIS:  There were a total of 10 respiratory events:  0 obstructive apneas, 0 central apneas and 0 mixed apneas with a total of 0 apneas and an apnea index (AI) of 0 /hour. There were 10 hypopneas with a hypopnea index of 1.8 /hour. The patient also had 10 respiratory event related arousals (RERAs).      The total APNEA/HYPOPNEA INDEX (AHI) was 1.8/hour and the total RESPIRATORY DISTURBANCE INDEX was 3.6 /hour.  6 events occurred in REM sleep and 8 events in NREM. The REM AHI was 5.5 /hour, versus a non-REM AHI of .9. The patient spent 77.5 minutes of total sleep time in the supine position and 252 minutes in non-supine.. The supine AHI was 0.0 versus a non-supine AHI of 2.4.  OXYGEN SATURATION & C02:  The Wake baseline 02 saturation was 96%, with the lowest being 86%. Time spent below 89% saturation equaled 14 minutes.  PERIODIC LIMB MOVEMENTS: The patient had a total of 6 Periodic Limb Movements.  The Periodic Limb Movement (PLM) index was 1.1 and the PLM Arousal index was .9/hour.  Post-study, the patient indicated that sleep was the same as usual.   IMPRESSION:  1.  Primary Snoring 2. Dysfunctions associated with sleep stages or arousal from sleep 3. Non-specific abnormal EKG  RECOMMENDATIONS:  1. This study does not demonstrate any significant obstructive or central sleep disordered breathing with the exception of mild to moderate snoring and borderline OSA when in REM sleep. For this, CPAP therapy is not warranted; weight loss may help. For disturbing snoring, an oral appliance (through a qualified dentist) can be considered.  2. This  study shows sleep fragmentation and abnormal sleep stage percentages; these are nonspecific findings and per se do not signify an intrinsic sleep disorder or a cause for the patient's sleep-related symptoms. Causes include (but are not limited to) the first night effect of the sleep study, circadian rhythm disturbances, medication effect or an underlying mood disorder or medical problem.  3. The study showed occasional PACs and PVCs on single lead EKG; clinical correlation is recommended and consultation with cardiology may be feasible.  4. The patient should be cautioned not to drive, work at heights, or operate dangerous or heavy equipment when tired or sleepy. Review and reiteration of good sleep hygiene measures should be pursued with any patient. 5. The patient will be seen in follow-up by Dr. Frances Furbish at [facility] for discussion of the test results and further management strategies. The referring provider will be notified of the test results.  I certify that I have reviewed the entire raw data recording prior to the issuance of this report in accordance with the Standards of Accreditation of the American Academy of Sleep Medicine (AASM)    Huston Foley, MD, PhD Diplomat, American Board of Psychiatry and Neurology (Neurology and Sleep Medicine)

## 2017-05-13 ENCOUNTER — Telehealth: Payer: Self-pay

## 2017-05-13 NOTE — Telephone Encounter (Signed)
-----   Message from Huston FoleySaima Athar, MD sent at 05/10/2017  1:36 PM EDT ----- Patient referred by Dr. Patsy Lageropland, seen by me on 04/10/17, diagnostic PSG on 05/08/17.   Please call and notify the patient that the recent sleep study did not show any significant obstructive sleep apnea with the exception of mild to moderate snoring and borderline OSA when in REM sleep. For this, CPAP therapy is not warranted; weight loss may help. For disturbing snoring, an oral appliance (through a qualified dentist) can be considered. Occ PVCs were seen on single lead EKG; she can d/w PCP if consultation with cardiology may be feasible. Please inform patient that we can go over the details of the study during a follow up appointment or FU as needed.  Once you have spoken to patient, you can close this encounter.   Thanks,  Huston FoleySaima Athar, MD, PhD [facility])

## 2017-05-13 NOTE — Telephone Encounter (Signed)
I called pt. I advised her that Dr. Frances FurbishAthar reviewed her sleep study and found that pt did not show any significant osa with the exception of mild to moderate snoring and borderline osa when in REM sleep. CPAP therapy is not warranted for this osa, but weight loss may help. If pt is disturbed by her snoring, she may consider an oral appliance made by a qualified dentist. Occasional PVCs were seen on single lead EKG and this can be discussed with her PCP if a consultation with cardiology is needed. I offered pt an appt with Dr. Frances FurbishAthar to go over the details of the sleep study during a follow up appt, or she may follow up as needed. Pt says that she would prefer to follow up as needed. Pt asked that Dr. Patsy Lageropland, PCP and referring doctor,  be sent a copy of her sleep study results. Pt verbalized understanding of results. Pt had no questions at this time but was encouraged to call back if questions arise.

## 2018-04-24 ENCOUNTER — Other Ambulatory Visit: Payer: Self-pay | Admitting: Family Medicine

## 2018-04-24 DIAGNOSIS — F3289 Other specified depressive episodes: Secondary | ICD-10-CM

## 2018-05-25 ENCOUNTER — Other Ambulatory Visit: Payer: Self-pay | Admitting: Family Medicine

## 2018-05-25 DIAGNOSIS — F3289 Other specified depressive episodes: Secondary | ICD-10-CM

## 2018-06-16 NOTE — Progress Notes (Deleted)
[facility] [address] [place], [place] [address] [phone]  Date:  06/18/2018   Name:  [name] [name]   DOB:  [birth year]   MRN:  [id number]  PCP:  Pearline Cables, MD    Chief Complaint: No chief complaint on file.   History of Present Illness:  [name] [name] is a 42 y.o. very pleasant female patient who presents with the following:  Following up/ med refill today History of migraine, GERD, depression I last saw her in march as a new pt, transfer from Dr. Beverely Low:  She has a history of migraine HA, depression and fatigue.  Last year Dr. Beverely Low had adjusted her lexapro as her depression sx were worsening.  She feels like this is really working well for her and that her depression is under good control She also uses butalbital prn for migraine ha- she feels that her HA are much better, she gets 0-1 per month She has an IUD and does have menses, but does not bleed much at all She is a middle Geneticist, molecular.  Work is busy but not overly stressful She has felt quite tired- she notes that she sleeps really deeply, but just still feels tired. She is noted to snore per her husband- this is pretty new. She will feel sleepy during the day, like she could go to sleep at any time.  She feels like she is still tired when she wakes up in the am Her energy is low  She had a pelvic sling in December- she is now able to exercise more easily but she feels too tired Her daughters are 21yo and 89 yo- they are in good health  We did have her see neurology-  She had a sleep study that did NOT show OSA: Please call and notify the patient that the recent sleep study did not show any significant obstructive sleep apnea with the exception of mild to moderate snoring and borderline OSA when in REM sleep. For this, CPAP therapy is not warranted; weight loss may help. For disturbing snoring, an oral appliance (through a qualified  dentist) can be considered. Occ PVCs were seen on single lead EKG; she can d/w PCP if consultation with cardiology may be feasible. Please inform patient that we can go over the details of the study during a follow up appointment or FU as needed.   Patient Active Problem List   Diagnosis Date Noted  . Migraine 01/18/2016  . Stress incontinence 08/06/2014  . Depression 08/06/2014  . Other malaise and fatigue 08/06/2014  . GERD (gastroesophageal reflux disease) 08/06/2014  . Seasonal allergies 08/06/2014  . Overweight (BMI 25.0-29.9) 08/06/2014    Past Medical History:  Diagnosis Date  . Anxiety   . Asthma    childhood  . Depression   . Diverticulitis   . HSV-2 infection   . Hypertension    white coat  . Migraine     Past Surgical History:  Procedure Laterality Date  . APPENDECTOMY    . INSERTION OF MESH     2017 bladder  . LEEP      Social History   Tobacco Use  . Smoking status: Never Smoker  . Smokeless tobacco: Never Used  Substance Use Topics  . Alcohol use: Yes  . Drug use: No    Family History  Problem Relation Age of Onset  . Hypertension Mother   . Cancer Mother   .  Crohn's disease Father   . Depression Sister   . Depression Brother     Allergies  Allergen Reactions  . Phenergan [Promethazine Hcl] Other (See Comments)    Pt advised that she was given a nausea medication when she was 17 caused her to like she could not move. Unsure if this was the medication.   [name] [name]. Omnicef [Cefdinir] Rash    Medication list has been reviewed and updated.  Current Outpatient Medications on File Prior to Visit  Medication Sig Dispense Refill  . butalbital-aspirin-caffeine (FIORINAL) 50-325-40 MG capsule Take 1 capsule by mouth every 6 (six) hours as needed for headache. 30 capsule 0  . escitalopram (LEXAPRO) 10 MG tablet TAKE 1 TABLET BY MOUTH DAILY 15 tablet 0  . levonorgestrel (MIRENA) 20 MCG/24HR IUD 1 each by Intrauterine route once.     No current  facility-administered medications on file prior to visit.     Review of Systems:  As per HPI- otherwise negative.   Physical Examination: There were no vitals filed for this visit. There were no vitals filed for this visit. There is no height or weight on file to calculate BMI. Ideal Body Weight:    GEN: WDWN, NAD, Non-toxic, A & O x 3 HEENT: Atraumatic, Normocephalic. Neck supple. No masses, No LAD. Ears and Nose: No external deformity. CV: RRR, No M/G/R. No JVD. No thrill. No extra heart sounds. PULM: CTA B, no wheezes, crackles, rhonchi. No retractions. No resp. distress. No accessory muscle use. ABD: S, NT, ND, +BS. No rebound. No HSM. EXTR: No c/c/e NEURO Normal gait.  PSYCH: Normally interactive. Conversant. Not depressed or anxious appearing.  Calm demeanor.    Assessment and Plan: ***  Signed Abbe AmsterdamJessica Copland, MD

## 2018-06-18 ENCOUNTER — Ambulatory Visit: Payer: BC Managed Care – PPO | Admitting: Family Medicine

## 2018-06-21 NOTE — Progress Notes (Addendum)
[name] [name] [address] [place], [place] [address] [phone]  Date:  06/23/2018   Name:  [name] [name]   DOB:  12/01/1975   MRN:  [id number]  PCP:  Pearline Cables, MD    Chief Complaint: Medication Refill and Depression   History of Present Illness:  [name] [name] is a 42 y.o. very pleasant female patient who presents with the following:  Here today for a med check History of allergies, GERD, migraine ha, depression Last seen by myself in 01/2017: First visit with myself today- pt has been seeing Dr. Beverely Low who has moved to a new location.  She has a history of migraine HA, depression and fatigue.  Last year Dr. Beverely Low had adjusted her lexapro as her depression sx were worsening.  She feels like this is really working well for her and that her depression is under good control She also uses butalbital prn for migraine ha- she feels that her HA are much better, she gets 0-1 per month  She has an IUD and does have menses, but does not bleed much at all She has never been a smoker She is a middle Geneticist, molecular.  Work is busy but not overly stressful She has felt quite tired- she notes that she sleeps really deeply, but just still feels tired. She is noted to snore per her husband- this is pretty new. She will feel sleepy during the day, like she could go to sleep at any time.  She feels like she is still tired when she wakes up in the am Her energy is low  She had a pelvic sling in December- she is now able to exercise more easily but she feels too tired Her daughters are 23yo and 85 yo- they are in good health  She did have a sleep study in 04-22-17 and was found NOT to have OSA  Her daughters are doing well, getting ready for school year to start. Pt also works in the school system so she will be getting back to work soon as well  They will be in 5th and 9th grades this fall  Her headaches are doing well-  she has changed to a low carb diet and has lost weight! She is feeling well and is down about 20 lbs, she would like to lose a bit more  Wt Readings from Last 3 Encounters:  06/23/18 171 lb 6.4 oz (77.7 kg)  04/10/17 192 lb (87.1 kg)  02/14/17 193 lb 6.4 oz (87.7 kg)   She feels like the lexapro 10 mg is working well for her- she has been on this since her mom died in 04-22-09 She tried to come off this in the past but did not do well She is coming up to have her mirena changed out soon and will do with her GYN She does see GYN for her pap   She is fasting for her labs today Patient Active Problem List   Diagnosis Date Noted  . Migraine 01/18/2016  . Stress incontinence 08/06/2014  . Depression 08/06/2014  . Other malaise and fatigue 08/06/2014  . GERD (gastroesophageal reflux disease) 08/06/2014  . Seasonal allergies 08/06/2014  . Overweight (BMI 25.0-29.9) 08/06/2014    Past Medical History:  Diagnosis Date  . Anxiety   . Asthma    childhood  . Depression   . Diverticulitis   . HSV-2 infection   . Hypertension  white coat  . Migraine     Past Surgical History:  Procedure Laterality Date  . APPENDECTOMY    . INSERTION OF MESH     2017 bladder  . LEEP      Social History   Tobacco Use  . Smoking status: Never Smoker  . Smokeless tobacco: Never Used  Substance Use Topics  . Alcohol use: Yes  . Drug use: No    Family History  Problem Relation Age of Onset  . Hypertension Mother   . Cancer Mother   . Crohn's disease Father   . Depression Sister   . Depression Brother     Allergies  Allergen Reactions  . Phenergan [Promethazine Hcl] Other (See Comments)    Pt advised that she was given a nausea medication when she was 17 caused her to like she could not move. Unsure if this was the medication.   Truman Hayward [Cefdinir] Rash    Medication list has been reviewed and updated.  Current Outpatient Medications on File Prior to Visit  Medication Sig Dispense  Refill  . levonorgestrel (MIRENA) 20 MCG/24HR IUD 1 each by Intrauterine route once.    . butalbital-aspirin-caffeine (FIORINAL) 50-325-40 MG capsule Take 1 capsule by mouth every 6 (six) hours as needed for headache. (Patient not taking: Reported on 06/23/2018) 30 capsule 0   No current facility-administered medications on file prior to visit.     Review of Systems:  As per HPI- otherwise negative. No fever or chills No CP or SOB  Physical Examination: Vitals:   06/23/18 1100  BP: 130/76  Pulse: (!) 53  Resp: 16  SpO2: 98%   Vitals:   06/23/18 1100  Weight: 171 lb 6.4 oz (77.7 kg)  Height: 5\' 8"  (1.727 m)   Body mass index is 26.06 kg/m. Ideal Body Weight: Weight in (lb) to have BMI = 25: 164.1  GEN: WDWN, NAD, Non-toxic, A & O x 3, has lost weight HEENT: Atraumatic, Normocephalic. Neck supple. No masses, No LAD. Bilateral TM wnl, oropharynx normal.  PEERL,EOMI.   Ears and Nose: No external deformity. CV: RRR, No M/G/R. No JVD. No thrill. No extra heart sounds. PULM: CTA B, no wheezes, crackles, rhonchi. No retractions. No resp. distress. No accessory muscle use. EXTR: No c/c/e NEURO Normal gait.  PSYCH: Normally interactive. Conversant. Not depressed or anxious appearing.  Calm demeanor.   Pulse Readings from Last 3 Encounters:  06/23/18 (!) 53  04/10/17 78  02/14/17 60    Assessment and Plan:  Screening for deficiency anemia - Plan: CBC  Other depression - Plan: escitalopram (LEXAPRO) 10 MG tablet  Screening for hyperlipidemia - Plan: Lipid panel  Screening for diabetes mellitus - Plan: Comprehensive metabolic panel, Hemoglobin A1c  Weight loss  Following up today Refilled her lexapro, discussed how to taper off if she does wish to stop using this Labs pending as above  Will plan further follow- up pending labs. Signed Abbe Amsterdam, MD  Received her labs 8/6-  Results for orders placed or performed in visit on 06/23/18  CBC  Result Value Ref  Range   WBC 6.3 4.0 - 10.5 K/uL   RBC 5.11 3.87 - 5.11 Mil/uL   Platelets 227.0 150.0 - 400.0 K/uL   Hemoglobin 14.7 12.0 - 15.0 g/dL   HCT 16.1 09.6 - 04.5 %   MCV 84.7 78.0 - 100.0 fl   MCHC 33.9 30.0 - 36.0 g/dL   RDW 40.9 81.1 - 91.4 %  Comprehensive metabolic panel  Result Value Ref Range   Sodium 139 135 - 145 mEq/L   Potassium 5.2 (H) 3.5 - 5.1 mEq/L   Chloride 104 96 - 112 mEq/L   CO2 29 19 - 32 mEq/L   Glucose, Bld 90 70 - 99 mg/dL   BUN 10 6 - 23 mg/dL   Creatinine, Ser 1.470.64 0.40 - 1.20 mg/dL   Total Bilirubin 0.6 0.2 - 1.2 mg/dL   Alkaline Phosphatase 35 (L) 39 - 117 U/L   AST 11 0 - 37 U/L   ALT 8 0 - 35 U/L   Total Protein 6.9 6.0 - 8.3 g/dL   Albumin 4.4 3.5 - 5.2 g/dL   Calcium 9.7 8.4 - 82.910.5 mg/dL   GFR 562.13107.70 >08.65>60.00 mL/min  Hemoglobin A1c  Result Value Ref Range   Hgb A1c MFr Bld 5.4 4.6 - 6.5 %  Lipid panel  Result Value Ref Range   Cholesterol 220 (H) 0 - 200 mg/dL   Triglycerides 78.444.0 0.0 - 149.0 mg/dL   HDL 69.6262.40 >95.28>39.00 mg/dL   VLDL 8.8 0.0 - 41.340.0 mg/dL   LDL Cholesterol 244149 (H) 0 - 99 mg/dL   Total CHOL/HDL Ratio 4    NonHDL 157.93    Letter to pt   Blood counts are normal Metabolic profile is normal except for minimally high potassium.  This can be caused by trauma to red blood cells during processing.  To be conservative I would recommend that we repeat your potassium as a lab visit only in about 2 weeks- I will place this order for you.  A1c does NOT show any sign of diabetes or pre-diabetes Cholesterol is ok- LDL is higher than we would like but your HDL- good cholesterol- helps to balance it out.  Continue to work on weight loss and we can recheck this in about one year.

## 2018-06-23 ENCOUNTER — Encounter: Payer: Self-pay | Admitting: Family Medicine

## 2018-06-23 ENCOUNTER — Ambulatory Visit: Payer: BC Managed Care – PPO | Admitting: Family Medicine

## 2018-06-23 VITALS — BP 130/76 | HR 60 | Resp 16 | Ht 68.0 in | Wt 171.4 lb

## 2018-06-23 DIAGNOSIS — Z130 Encounter for screening for diseases of the blood and blood-forming organs and certain disorders involving the immune mechanism: Secondary | ICD-10-CM | POA: Diagnosis not present

## 2018-06-23 DIAGNOSIS — Z131 Encounter for screening for diabetes mellitus: Secondary | ICD-10-CM | POA: Diagnosis not present

## 2018-06-23 DIAGNOSIS — Z13220 Encounter for screening for lipoid disorders: Secondary | ICD-10-CM

## 2018-06-23 DIAGNOSIS — F3289 Other specified depressive episodes: Secondary | ICD-10-CM

## 2018-06-23 DIAGNOSIS — R634 Abnormal weight loss: Secondary | ICD-10-CM

## 2018-06-23 DIAGNOSIS — E875 Hyperkalemia: Secondary | ICD-10-CM

## 2018-06-23 LAB — CBC
HCT: 43.3 % (ref 36.0–46.0)
Hemoglobin: 14.7 g/dL (ref 12.0–15.0)
MCHC: 33.9 g/dL (ref 30.0–36.0)
MCV: 84.7 fl (ref 78.0–100.0)
Platelets: 227 10*3/uL (ref 150.0–400.0)
RBC: 5.11 Mil/uL (ref 3.87–5.11)
RDW: 12.9 % (ref 11.5–15.5)
WBC: 6.3 10*3/uL (ref 4.0–10.5)

## 2018-06-23 LAB — LIPID PANEL
Cholesterol: 220 mg/dL — ABNORMAL HIGH (ref 0–200)
HDL: 62.4 mg/dL (ref >39.00)
LDL Cholesterol: 149 mg/dL — ABNORMAL HIGH (ref 0–99)
NonHDL: 157.93
Total CHOL/HDL Ratio: 4
Triglycerides: 44 mg/dL (ref 0.0–149.0)
VLDL: 8.8 mg/dL (ref 0.0–40.0)

## 2018-06-23 LAB — HEMOGLOBIN A1C: Hgb A1c MFr Bld: 5.4 % (ref 4.6–6.5)

## 2018-06-23 LAB — COMPREHENSIVE METABOLIC PANEL
ALT: 8 U/L (ref 0–35)
AST: 11 U/L (ref 0–37)
Albumin: 4.4 g/dL (ref 3.5–5.2)
Alkaline Phosphatase: 35 U/L — ABNORMAL LOW (ref 39–117)
BUN: 10 mg/dL (ref 6–23)
CO2: 29 mEq/L (ref 19–32)
Calcium: 9.7 mg/dL (ref 8.4–10.5)
Chloride: 104 mEq/L (ref 96–112)
Creatinine, Ser: 0.64 mg/dL (ref 0.40–1.20)
GFR: 107.7 mL/min (ref >60.00)
Glucose, Bld: 90 mg/dL (ref 70–99)
Potassium: 5.2 mEq/L — ABNORMAL HIGH (ref 3.5–5.1)
Sodium: 139 mEq/L (ref 135–145)
Total Bilirubin: 0.6 mg/dL (ref 0.2–1.2)
Total Protein: 6.9 g/dL (ref 6.0–8.3)

## 2018-06-23 MED ORDER — ESCITALOPRAM OXALATE 10 MG PO TABS: ORAL_TABLET | ORAL | 3 refills | Status: DC

## 2018-06-23 NOTE — Patient Instructions (Signed)
Great to see you today!  congrats on your weight loss I'll be in touch with your labs asap  If you would like to try tapering off lexapro that is ok- I would take a 1/2 tablet daily for 2 weeks, then every other day for 2weeks, then stop. However if you prefer to keep taking it that is also fine!

## 2018-06-24 NOTE — Addendum Note (Signed)
Addended by: Abbe AmsterdamOPLAND, JESSICA C on: 06/24/2018 02:05 PM   Modules accepted: Orders

## 2018-08-18 ENCOUNTER — Other Ambulatory Visit: Payer: Self-pay | Admitting: Obstetrics and Gynecology

## 2018-08-18 DIAGNOSIS — R921 Mammographic calcification found on diagnostic imaging of breast: Secondary | ICD-10-CM

## 2018-08-21 ENCOUNTER — Other Ambulatory Visit: Payer: BC Managed Care – PPO

## 2018-08-22 ENCOUNTER — Other Ambulatory Visit: Payer: Self-pay | Admitting: Obstetrics and Gynecology

## 2018-08-22 ENCOUNTER — Ambulatory Visit
Admission: RE | Admit: 2018-08-22 | Discharge: 2018-08-22 | Disposition: A | Discharge status: Home / Self Care | Payer: BC Managed Care – PPO | Source: Ambulatory Visit | Attending: Obstetrics and Gynecology | Admitting: Obstetrics and Gynecology

## 2018-08-22 DIAGNOSIS — R921 Mammographic calcification found on diagnostic imaging of breast: Secondary | ICD-10-CM

## 2018-11-10 ENCOUNTER — Ambulatory Visit: Payer: Self-pay | Admitting: *Deleted

## 2018-11-10 ENCOUNTER — Telehealth: Payer: Self-pay

## 2018-11-10 MED ORDER — OSELTAMIVIR PHOSPHATE 75 MG PO CAPS: 75.0000 mg | ORAL_CAPSULE | Freq: Every day | ORAL | 0 refills | Status: DC

## 2018-11-10 NOTE — Telephone Encounter (Signed)
Pt. Calling, stating tamiflu rx was not received by [facility] pharmacy. Order appeared to be "phoned in" so author re-sent it as "normal" electronically. Pt. instructed to contact [facility] in the next hour to ensure they had received it, and to message Dr. Patsy Lageropland if it was not received at that point.

## 2018-11-10 NOTE — Telephone Encounter (Signed)
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  Reason for Disposition . 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 TYPE of EXPOSURE: "How were you exposed?" (e.g., close contact, not a close contact)     Close contact with daughter  2. DATE of EXPOSURE: "When did the exposure occur?" (e.g., hour, days, weeks)     Days: daughter sick 11/08/18 and diagnosed 11/09/18 3. PREGNANCY: "Is there any chance you are pregnant?" "When was your last menstrual period?"     No LMP 11/06/18 4. HIGH RISK for COMPLICATIONS: "Do you have any heart or lung problems? Do you have a weakened immune system?" (e.g., CHF, COPD, asthma, HIV positive, chemotherapy, renal failure, diabetes mellitus, sickle cell anemia)     no 5. SYMPTOMS: "Do you have any symptoms?" (e.g., cough, fever, sore throat, difficulty breathing).     Scratchy throat  Protocols used: INFLUENZA EXPOSURE-A-AH

## 2018-11-10 NOTE — Addendum Note (Signed)
Addended by: Abbe AmsterdamOPLAND, JESSICA C on: 11/10/2018 12:18 PM   Modules accepted: Orders

## 2018-11-10 NOTE — Telephone Encounter (Signed)
Called in, I called pt and let her know

## 2019-02-23 ENCOUNTER — Other Ambulatory Visit: Payer: Self-pay

## 2019-02-23 ENCOUNTER — Ambulatory Visit
Admission: RE | Admit: 2019-02-23 | Discharge: 2019-02-23 | Disposition: A | Discharge status: Home / Self Care | Payer: BC Managed Care – PPO | Source: Ambulatory Visit | Attending: Obstetrics and Gynecology | Admitting: Obstetrics and Gynecology

## 2019-02-23 ENCOUNTER — Other Ambulatory Visit: Payer: Self-pay | Admitting: Obstetrics and Gynecology

## 2019-02-23 DIAGNOSIS — R921 Mammographic calcification found on diagnostic imaging of breast: Secondary | ICD-10-CM

## 2019-07-05 ENCOUNTER — Other Ambulatory Visit: Payer: Self-pay | Admitting: Family Medicine

## 2019-07-05 DIAGNOSIS — F3289 Other specified depressive episodes: Secondary | ICD-10-CM

## 2019-07-07 NOTE — Progress Notes (Signed)
[facility] [address], [place], [place] [address] [phone]  Date:  07/09/2019   Name:  [name] [name]   DOB:  1976-02-05   MRN:  [id number]  PCP:  Darreld Mclean, MD    Chief Complaint: No chief complaint on file.   History of Present Illness:  [name] [name] is a 43 y.o. very pleasant female patient who presents with the following:  Follow-up visit today to discuss medications-virtual visit Patient location is home, provider's office Patient identity confirmed with 2 factors, she gives consent for virtual visit today Shockingly her husband collapsed with a brain tumor this past "Sunday, he had surgery on Monday.  We are not sure if he has cancer yet. 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  Wonders if I can refill this med for her to use at night for the time being   Most recent labs 1 year ago Patient Active Problem List   Diagnosis Date Noted  . Migraine 01/18/2016  . Stress incontinence 08/06/2014  . Depression 08/06/2014  . Other malaise and fatigue 08/06/2014  . GERD (gastroesophageal reflux disease) 08/06/2014  . Seasonal allergies 08/06/2014  . Overweight (BMI 25.0-29.9) 08/06/2014    Past Medical History:  Diagnosis Date  . Anxiety   . Asthma    childhood  . Depression   . Diverticulitis   . HSV-2 infection   . Hypertension    white coat  . Migraine     Past Surgical History:  Procedure Laterality Date  . APPENDECTOMY    . INSERTION OF MESH     20" 17 bladder  . LEEP       Social History   Tobacco Use  . Smoking status: Never Smoker  . Smokeless tobacco: Never Used  Substance Use Topics  . Alcohol use: Yes  . Drug use: No    Family History  Problem Relation Age of Onset  . Hypertension Mother   . Cancer Mother   . Crohn's disease Father   . Depression Sister   . Depression Brother     Allergies  Allergen Reactions  . Phenergan [Promethazine Hcl] Other (See Comments)    Pt advised that she was given a nausea medication when she was 17 caused her to like she could not move. Unsure if this was the medication.   Carole Civil [Cefdinir] Rash    Medication list has been reviewed and updated.  Current Outpatient Medications on File Prior to Visit  Medication Sig Dispense Refill  . butalbital-aspirin-caffeine (FIORINAL) 50-325-40 MG capsule Take 1 capsule by mouth every 6 (six) hours as needed for headache. (Patient not taking: Reported on 06/23/2018) 30 capsule 0  . escitalopram (LEXAPRO) 10 MG tablet TAKE 1 TABLET BY MOUTH DAILY 30 tablet 0  . levonorgestrel (MIRENA) 20 MCG/24HR IUD 1 each by Intrauterine route once.    Marland Kitchen oseltamivir (TAMIFLU) 75 MG capsule Take 1 capsule (75 mg total) by mouth daily. 10 capsule 0   No  current facility-administered medications on file prior to visit.     Review of Systems:  As per HPI- otherwise negative.   Physical Examination: There were no vitals filed for this visit. There were no vitals filed for this visit. There is no height or weight on file to calculate BMI. Ideal Body Weight:    Pt observed over video  She looks well, no wheezing or cough No SOB or distress  Assessment and Plan:   ICD-10-CM   1. Acute stress reaction  F43.0 ALPRAZolam (XANAX) 0.25 MG tablet  2. Other depression  F32.89 escitalopram (LEXAPRO) 10 MG tablet     Follow-up: No follow-ups on file.  Meds ordered this encounter  Medications  . escitalopram (LEXAPRO) 10 MG tablet    Sig: Take 1 tablet (10 mg total) by mouth  daily.    Dispense:  90 tablet    Refill:  3  . ALPRAZolam (XANAX) 0.25 MG tablet    Sig: Take 1 tablet (0.25 mg total) by mouth 3 (three) times daily as needed for anxiety.    Dispense:  30 tablet    Refill:  1   No orders of the defined types were placed in this encounter.   @SIGN @  Outpatient Encounter Medications as of 07/09/2019  Medication Sig  . ALPRAZolam (XANAX) 0.25 MG tablet Take 1 tablet (0.25 mg total) by mouth 3 (three) times daily as needed for anxiety.  . butalbital-aspirin-caffeine (FIORINAL) 50-325-40 MG capsule Take 1 capsule by mouth every 6 (six) hours as needed for headache. (Patient not taking: Reported on 06/23/2018)  . escitalopram (LEXAPRO) 10 MG tablet Take 1 tablet (10 mg total) by mouth daily.  Marland Kitchen. levonorgestrel (MIRENA) 20 MCG/24HR IUD 1 each by Intrauterine route once.  Marland Kitchen. oseltamivir (TAMIFLU) 75 MG capsule Take 1 capsule (75 mg total) by mouth daily.  . [DISCONTINUED] escitalopram (LEXAPRO) 10 MG tablet TAKE 1 TABLET BY MOUTH DAILY   No facility-administered encounter medications on file as of 07/09/2019.    Here today for a follow-up visit Acute stress with husband's recent dx of brain tumor.  She would like some xanax to take at night which is fine I refilled for her today, advised her to use as little as she can due to risk of habit formation.  Also cautioned regarding sedation    Offered support, we are here if she needs anything  Signed Abbe AmsterdamJessica Aryon Nham, MD

## 2019-07-09 ENCOUNTER — Ambulatory Visit (INDEPENDENT_AMBULATORY_CARE_PROVIDER_SITE_OTHER): Payer: BC Managed Care – PPO | Admitting: Family Medicine

## 2019-07-09 ENCOUNTER — Encounter: Payer: Self-pay | Admitting: Family Medicine

## 2019-07-09 ENCOUNTER — Other Ambulatory Visit: Payer: Self-pay

## 2019-07-09 DIAGNOSIS — F430 Acute stress reaction: Secondary | ICD-10-CM

## 2019-07-09 DIAGNOSIS — F3289 Other specified depressive episodes: Secondary | ICD-10-CM

## 2019-07-09 MED ORDER — ALPRAZOLAM 0.25 MG PO TABS: 0.2500 mg | ORAL_TABLET | Freq: Three times a day (TID) | ORAL | 1 refills | Status: DC | PRN

## 2019-07-09 MED ORDER — ESCITALOPRAM OXALATE 10 MG PO TABS: 10.0000 mg | ORAL_TABLET | Freq: Every day | ORAL | 3 refills | Status: DC

## 2019-08-06 ENCOUNTER — Other Ambulatory Visit: Payer: Self-pay | Admitting: Family Medicine

## 2019-08-06 DIAGNOSIS — F3289 Other specified depressive episodes: Secondary | ICD-10-CM

## 2019-08-25 ENCOUNTER — Encounter: Payer: Self-pay | Admitting: Family Medicine

## 2019-09-02 ENCOUNTER — Ambulatory Visit
Admission: RE | Admit: 2019-09-02 | Discharge: 2019-09-02 | Disposition: A | Discharge status: Home / Self Care | Payer: BC Managed Care – PPO | Source: Ambulatory Visit | Attending: Obstetrics and Gynecology | Admitting: Obstetrics and Gynecology

## 2019-09-02 ENCOUNTER — Other Ambulatory Visit: Payer: Self-pay

## 2019-09-02 DIAGNOSIS — R921 Mammographic calcification found on diagnostic imaging of breast: Secondary | ICD-10-CM

## 2019-11-24 NOTE — Progress Notes (Deleted)
[facility] [address] [place], [place] [address] [phone]  Date:  11/25/2019   Name:  [name] [name]   DOB:  08-09-1976   MRN:  [id number]  PCP:  Pearline Cables, MD    Chief Complaint: No chief complaint on file.   History of Present Illness:  [name] [name] is a 44 y.o. 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 Migraine 01/18/2016  . Stress incontinence 08/06/2014  . Depression 08/06/2014  . Other malaise and fatigue 08/06/2014  . GERD (gastroesophageal reflux disease) 08/06/2014  . Seasonal allergies 08/06/2014  . Overweight (BMI 25.0-29.9) 08/06/2014    Past Medical History:  Diagnosis Date  . Anxiety   . Asthma    childhood  . Depression   . Diverticulitis   . HSV-2 infection   . Hypertension    white coat  . Migraine     Past Surgical History:  Procedure Laterality Date  . APPENDECTOMY    . INSERTION OF MESH     2017 bladder  . LEEP      Social History   Tobacco Use  . Smoking status: Never Smoker  . Smokeless tobacco: Never Used  Substance Use Topics  . Alcohol use: Yes  . Drug use: No    Family History  Problem Relation Age of Onset  . Hypertension Mother   . Cancer Mother   . Crohn's disease Father   . Depression  Sister   . Depression Brother     Allergies  Allergen Reactions  . Phenergan [Promethazine Hcl] Other (See Comments)    Pt advised that she was given a nausea medication when she was 17 caused her to like she could not move. Unsure if this was the medication.   Truman Hayward [Cefdinir] Rash    Medication list has been reviewed and updated.  Current Outpatient Medications on File Prior to Visit  Medication Sig Dispense Refill  . ALPRAZolam (XANAX) 0.25 MG tablet Take 1 tablet (0.25 mg total) by mouth 3 (three) times daily as needed for anxiety. 30 tablet 1  . butalbital-aspirin-caffeine (FIORINAL) 50-325-40 MG capsule Take 1 capsule by mouth every 6 (six) hours as needed for headache. (Patient not taking: Reported on 06/23/2018) 30 capsule 0  . escitalopram (LEXAPRO) 10 MG tablet TAKE 1 TABLET BY MOUTH DAILY 30 tablet 5  . levonorgestrel (MIRENA) 20 MCG/24HR IUD 1 each by Intrauterine route once.    Marland Kitchen oseltamivir (TAMIFLU) 75 MG capsule Take 1 capsule (75 mg total) by mouth daily. 10 capsule 0   No current facility-administered medications on file prior to visit.    Review of Systems:  As per HPI- otherwise negative.   Physical Examination: There were no vitals filed for this visit. There were no vitals filed for  this visit. There is no height or weight on file to calculate BMI. Ideal Body Weight:    ***  Assessment and Plan: ***  Signed Lamar Blinks, MD

## 2019-11-25 ENCOUNTER — Ambulatory Visit: Payer: BC Managed Care – PPO | Admitting: Family Medicine

## 2019-11-26 ENCOUNTER — Encounter: Payer: Self-pay | Admitting: Family Medicine

## 2019-11-26 ENCOUNTER — Ambulatory Visit (INDEPENDENT_AMBULATORY_CARE_PROVIDER_SITE_OTHER): Payer: BC Managed Care – PPO | Admitting: Family Medicine

## 2019-11-26 ENCOUNTER — Other Ambulatory Visit: Payer: Self-pay

## 2019-11-26 DIAGNOSIS — F3289 Other specified depressive episodes: Secondary | ICD-10-CM

## 2019-11-26 DIAGNOSIS — F430 Acute stress reaction: Secondary | ICD-10-CM

## 2019-11-26 MED ORDER — ESCITALOPRAM OXALATE 20 MG PO TABS: 10.0000 mg | ORAL_TABLET | Freq: Every day | ORAL | 3 refills | Status: DC

## 2019-11-26 MED ORDER — ALPRAZOLAM 0.25 MG PO TABS: 0.2500 mg | ORAL_TABLET | Freq: Three times a day (TID) | ORAL | 1 refills | Status: DC | PRN

## 2019-11-26 NOTE — Progress Notes (Signed)
[facility] at [facility] [address], [address] [place], [place] [address] [phone]  Date:  11/26/2019   Name:  [name] [name]   DOB:  29-Jul-1976   MRN:  [id number]  PCP:  Pearline Cables, MD    Chief Complaint: No chief complaint on file.   History of Present Illness:  [name] [name] is a 44 y.o. very pleasant female patient who presents with the following:  Virtual visit today due to concern of Patient location is home, provider location is office Patient identity confirmed with 2 factors, she gives consent for virtual visit today The patient and myself are present on today's call  Last seen by myself in August to discuss acute stress-her husband had collapsed, and was diagnosed with a brain tumor.  At that time I gave her a small supply of Xanax She is also taking Lexapro  Reviewed controlled substance database, only entry is alprazolam prescription from August  On 12/17 her husband had a seizure- he was in ICU for 5 days Then last week he was readmitted, found to have PE and pneumonia He is hopefully coming home today  He has done chemo and radiation, he is now doing periodic oral chemo- he did one course so far.  It was hard on him, so they have not yet repeated this course She is doing counseling when she is able  Taking lexapro 10 mg- would like to increase her dose on the advice of her counselor She has a couple of xanax left- would like to have a refill  They did get a puppy around Thanksgiving, this has been a mixed blessing.  He is a lot of work, but patient notes that he helps her get out of bed, she enjoys taking him for walks.  She denies any suicidal ideation Patient Active Problem List   Diagnosis Date Noted  . Migraine 01/18/2016  . Stress incontinence 08/06/2014  . Depression 08/06/2014  . Other malaise and fatigue 08/06/2014  . GERD (gastroesophageal reflux disease) 08/06/2014  . Seasonal allergies  08/06/2014  . Overweight (BMI 25.0-29.9) 08/06/2014    Past Medical History:  Diagnosis Date  . Anxiety   . Asthma    childhood  . Depression   . Diverticulitis   . HSV-2 infection   . Hypertension    white coat  . Migraine     Past Surgical History:  Procedure Laterality Date  . APPENDECTOMY    . INSERTION OF MESH     2017 bladder  . LEEP      Social History   Tobacco Use  . Smoking status: Never Smoker  . Smokeless tobacco: Never Used  Substance Use Topics  . Alcohol use: Yes  . Drug use: No    Family History  Problem Relation Age of Onset  . Hypertension Mother   . Cancer Mother   . Crohn's disease Father   . Depression Sister   . Depression Brother     Allergies  Allergen Reactions  . Phenergan [Promethazine Hcl] Other (See Comments)    Pt advised that she was given a nausea medication when she was 17 caused her to like she could not move. Unsure if this was the medication.   Truman Hayward [Cefdinir] Rash    Medication list has been reviewed and updated.  Current Outpatient Medications on File Prior to Visit  Medication Sig Dispense Refill  . ALPRAZolam (XANAX) 0.25 MG tablet Take 1 tablet (0.25  mg total) by mouth 3 (three) times daily as needed for anxiety. 30 tablet 1  . butalbital-aspirin-caffeine (FIORINAL) 50-325-40 MG capsule Take 1 capsule by mouth every 6 (six) hours as needed for headache. (Patient not taking: Reported on 06/23/2018) 30 capsule 0  . escitalopram (LEXAPRO) 10 MG tablet TAKE 1 TABLET BY MOUTH DAILY 30 tablet 5  . levonorgestrel (MIRENA) 20 MCG/24HR IUD 1 each by Intrauterine route once.    Marland Kitchen oseltamivir (TAMIFLU) 75 MG capsule Take 1 capsule (75 mg total) by mouth daily. 10 capsule 0   No current facility-administered medications on file prior to visit.    Review of Systems:  As per HPI- otherwise negative. No fever or chills  Physical Examination: There were no vitals filed for this visit. There were no vitals filed for  this visit. There is no height or weight on file to calculate BMI. Ideal Body Weight:    Patient observed over video monitor.  She looks well, no cough, wheezing, distress is noted  Assessment and Plan: Acute stress reaction - Plan: ALPRAZolam (XANAX) 0.25 MG tablet  Other depression - Plan: escitalopram (LEXAPRO) 20 MG tablet  Very sad situation in which the patient's husband has been diagnosed with a malignant brain tumor.  I am not clear what his outlook is, but he recently had a seizure and is not able to drive for 6 months.  He is currently in the hospital being treated for pulmonary embolism.  The patient is understandably suffering from depression and anxiety.  We will increase her Lexapro to 20 mg, I refilled her alprazolam.  She uses this mostly just for sleep, has not been overusing  I asked her to follow-up with me in 3 months, sooner if she needs anything  Signed Lamar Blinks, MD

## 2019-11-27 ENCOUNTER — Other Ambulatory Visit: Payer: Self-pay

## 2019-11-27 MED ORDER — ESCITALOPRAM OXALATE 20 MG PO TABS: 20.0000 mg | ORAL_TABLET | Freq: Every day | ORAL | 1 refills | Status: DC

## 2019-12-04 ENCOUNTER — Encounter: Payer: Self-pay | Admitting: Family Medicine

## 2020-01-05 ENCOUNTER — Encounter: Payer: Self-pay | Admitting: Family Medicine

## 2020-01-05 ENCOUNTER — Other Ambulatory Visit: Payer: Self-pay

## 2020-01-05 NOTE — Progress Notes (Signed)
[facility] [address] [place], [place] [address] [phone]  Date:  01/06/2020   Name:  [name] [name]   DOB:  03-23-1976   MRN:  [id number]  PCP:  Pearline Cables, MD    Chief Complaint: Chest Pain (pt states its soreness & only hurts when she moves an upper extremity , bothers pt when sleeping or laying down. onset : 7 months )   History of Present Illness:  [name] [name] is a 44 y.o. very pleasant female patient who presents with the following:  Generally healthy woman with history of migraine, GERD, depression Here today with concern of chest wall pain Her husband is being treated for a brain tumor right now, this has been a really hard and she has been under a lot of stress.  He is currently back home and doing somewhat better, they plan to begin oral chemotherapy soon Last seen here on January 7-at that time we increased her Lexapro and refilled alprazolam She does feel like the increased Lexapro helped, though it did cause her to put on a little bit of weight  She has noted some chest discomfort since September of last year It feels sore to press on the area or move her arms sometimes- esp if she lays on her side in bed Pain is not severe Pain is present daily No SOB or chest pressure No exertional CP noted   Never a smoker  Mammogram is up-to-date and normal mirena IUD  Patient Active Problem List   Diagnosis Date Noted  . Migraine 01/18/2016  . Stress incontinence 08/06/2014  . Depression 08/06/2014  . Other malaise and fatigue 08/06/2014  . GERD (gastroesophageal reflux disease) 08/06/2014  . Seasonal allergies 08/06/2014  . Overweight (BMI 25.0-29.9) 08/06/2014    Past Medical History:  Diagnosis Date  . Anxiety   . Asthma    childhood  . Depression   . Diverticulitis   . HSV-2 infection   . Hypertension    white coat  . Migraine     Past Surgical History:  Procedure Laterality  Date  . APPENDECTOMY    . INSERTION OF MESH     2017 bladder  . LEEP      Social History   Tobacco Use  . Smoking status: Never Smoker  . Smokeless tobacco: Never Used  Substance Use Topics  . Alcohol use: Yes  . Drug use: No    Family History  Problem Relation Age of Onset  . Hypertension Mother   . Cancer Mother   . Crohn's disease Father   . Depression Sister   . Depression Brother     Allergies  Allergen Reactions  . Phenergan [Promethazine Hcl] Other (See Comments)    Pt advised that she was given a nausea medication when she was 17 caused her to like she could not move. Unsure if this was the medication.   Truman Hayward [Cefdinir] Rash    Medication list has been reviewed and updated.  Current Outpatient Medications on File Prior to Visit  Medication Sig Dispense Refill  . butalbital-aspirin-caffeine (FIORINAL) 50-325-40 MG capsule Take 1 capsule by mouth every 6 (six) hours as needed for headache. 30 capsule 0  . escitalopram (LEXAPRO) 20 MG tablet Take 0.5 tablets (10 mg total) by mouth daily. 90 tablet 3  . escitalopram (LEXAPRO) 20 MG tablet Take 1 tablet (20 mg total) by mouth daily. 90 tablet 1  .  levonorgestrel (MIRENA) 20 MCG/24HR IUD 1 each by Intrauterine route once.    . ALPRAZolam (XANAX) 0.25 MG tablet Take 1 tablet (0.25 mg total) by mouth 3 (three) times daily as needed for anxiety. (Patient not taking: Reported on 01/06/2020) 30 tablet 1  . oseltamivir (TAMIFLU) 75 MG capsule Take 1 capsule (75 mg total) by mouth daily. (Patient not taking: Reported on 01/06/2020) 10 capsule 0   No current facility-administered medications on file prior to visit.    Review of Systems:  As per HPI- otherwise negative.   Physical Examination: Vitals:   01/06/20 1512  BP: 112/80  Pulse: 81  Resp: 16  Temp: (!) 97.4 F (36.3 C)  SpO2: 98%   Vitals:   01/06/20 1512  Weight: 162 lb 12.8 oz (73.8 kg)  Height: 5\' 8"  (1.727 m)   Body mass index is 24.75  kg/m. Ideal Body Weight: Weight in (lb) to have BMI = 25: 164.1  GEN: no acute distress.  Normal weight, looks well  HEENT: Atraumatic, Normocephalic.  Ears and Nose: No external deformity. CV: RRR, No M/G/R. No JVD. No thrill. No extra heart sounds. PULM: CTA B, no wheezes, crackles, rhonchi. No retractions. No resp. distress. No accessory muscle use. Unable to reproduce tenderness by pressing on the upper costochondral joints, more on the right side ABD: S, NT, ND, +BS. No rebound. No HSM. EXTR: No c/c/e PSYCH: Normally interactive. Conversant.  EKG: NSR, no significant change from EKG dated 2015 on chart   Assessment and Plan: Chest pain, unspecified type - Plan: EKG 12-Lead, DG Chest 2 View, meloxicam (MOBIC) 7.5 MG tablet  Here today with apparent musculoskeletal chest pain EKG is reassuring Obtain chest film today Assuming chest film negative, suggest a 2-week trial of anti-inflammatories.  Mentioned increased risk of GI bleeding when combining NSAIDs with SSRI.  Patient will keep me posted about how she is feeling  This visit occurred during the SARS-CoV-2 public health emergency.  Safety protocols were in place, including screening questions prior to the visit, additional usage of staff PPE, and extensive cleaning of exam room while observing appropriate contact time as indicated for disinfecting solutions.  Moderate medical decision making Signed Lamar Blinks, MD  Received chest film, message to patient DG Chest 2 View  Result Date: 01/06/2020 CLINICAL DATA:  44 year old female with history of chest pain for the past 6 months. EXAM: CHEST - 2 VIEW COMPARISON:  No priors. FINDINGS: Lung volumes are normal. No consolidative airspace disease. No pleural effusions. No pneumothorax. No pulmonary nodule or mass noted. Pulmonary vasculature and the cardiomediastinal silhouette are within normal limits. IMPRESSION: No radiographic evidence of acute cardiopulmonary disease.  Electronically Signed   By: Vinnie Langton M.D.   On: 01/06/2020 16:06

## 2020-01-06 ENCOUNTER — Ambulatory Visit: Payer: BC Managed Care – PPO | Admitting: Family Medicine

## 2020-01-06 ENCOUNTER — Encounter: Payer: Self-pay | Admitting: Family Medicine

## 2020-01-06 ENCOUNTER — Ambulatory Visit (HOSPITAL_BASED_OUTPATIENT_CLINIC_OR_DEPARTMENT_OTHER)
Admission: RE | Admit: 2020-01-06 | Discharge: 2020-01-06 | Disposition: A | Discharge status: Home / Self Care | Payer: BC Managed Care – PPO | Source: Ambulatory Visit | Attending: Family Medicine | Admitting: Family Medicine

## 2020-01-06 VITALS — BP 112/80 | HR 81 | Temp 97.4°F | Resp 16 | Ht 68.0 in | Wt 162.8 lb

## 2020-01-06 DIAGNOSIS — R079 Chest pain, unspecified: Secondary | ICD-10-CM | POA: Diagnosis not present

## 2020-01-06 MED ORDER — MELOXICAM 7.5 MG PO TABS: 7.5000 mg | ORAL_TABLET | Freq: Every day | ORAL | 0 refills | Status: DC

## 2020-01-06 NOTE — Patient Instructions (Addendum)
Good to see you today I am glad that your husband is back home  Please go to the ground floor for a chest x-ray, then you can go home Assuming your chest film is normal, let's plan to try NSAIDs for about 2 weeks and see if it helps I gave you an rx for meloxicam to take daily

## 2020-09-08 ENCOUNTER — Encounter: Payer: Self-pay | Admitting: Family Medicine

## 2020-09-08 MED ORDER — ESCITALOPRAM OXALATE 20 MG PO TABS: 20.0000 mg | ORAL_TABLET | Freq: Every day | ORAL | 1 refills | Status: DC

## 2020-10-06 ENCOUNTER — Other Ambulatory Visit: Payer: Self-pay | Admitting: Obstetrics and Gynecology

## 2020-10-06 DIAGNOSIS — R921 Mammographic calcification found on diagnostic imaging of breast: Secondary | ICD-10-CM

## 2020-10-31 ENCOUNTER — Encounter: Payer: BC Managed Care – PPO | Admitting: Family Medicine

## 2020-11-04 NOTE — Progress Notes (Addendum)
[facility] [address], [place] [address] [phone]  Date:  11/09/2020   Name:  [name] [name]   DOB:  09-15-1976   MRN:  [id number]  PCP:  Pearline Cables, MD    Chief Complaint: Annual Exam   History of Present Illness:  [name] [name] is a 44 y.o. very pleasant female patient who presents with the following:  Here today for a CPE- Generally healthy woman with history of migraine, GERD, depression Last seen by myself in February of this year   Hep C screening covid 19 series- done  Flu vaccine- done  Pap UTD Colon cancer screening  Most recent labs on chart from 2019- she is not fasting  mammo is UTD  Her husband was quite ill earlier this year with a brain tumor - he is fairly stable and is done with chemo, he is feeling pretty well. He underwent shoulder surgery recently as well.  Pt feels like she is doing ok emotionally. She is using her lexapro but does not generally need xanax She is sleeping at night- she is on a new diet which includes a "calm" powder that she uses at night She is waking up with headaches daily but this is not new at all  She also notes some concerns about her memory- she is not sure if this is due to stress, notes she has always been forgetful.  No dramatic sx such as getting lost in a familiar area No issues with her job  Patient Active Problem List   Diagnosis Date Noted  . Migraine 01/18/2016  . Stress incontinence 08/06/2014  . Depression 08/06/2014  . Other malaise and fatigue 08/06/2014  . GERD (gastroesophageal reflux disease) 08/06/2014  . Seasonal allergies 08/06/2014  . Overweight (BMI 25.0-29.9) 08/06/2014    Past Medical History:  Diagnosis Date  . Anxiety   . Asthma    childhood  . Depression   . Diverticulitis   . HSV-2 infection   . Hypertension    white coat  . Migraine     Past Surgical History:  Procedure Laterality Date  .  APPENDECTOMY    . INSERTION OF MESH     2017 bladder  . LEEP      Social History   Tobacco Use  . Smoking status: Never Smoker  . Smokeless tobacco: Never Used  Substance Use Topics  . Alcohol use: Yes  . Drug use: No    Family History  Problem Relation Age of Onset  . Hypertension Mother   . Cancer Mother   . Crohn's disease Father   . Depression Sister   . Depression Brother     Allergies  Allergen Reactions  . Phenergan [Promethazine Hcl] Other (See Comments)    Pt advised that she was given a nausea medication when she was 17 caused her to like she could not move. Unsure if this was the medication.   Truman Hayward [Cefdinir] Rash    Medication list has been reviewed and updated.  Current Outpatient Medications on File Prior to Visit  Medication Sig Dispense Refill  . ALPRAZolam (XANAX) 0.25 MG tablet Take 1 tablet (0.25 mg total) by mouth 3 (three) times daily as needed for anxiety. 30 tablet 1  . butalbital-aspirin-caffeine (FIORINAL) 50-325-40 MG capsule Take 1 capsule by mouth every 6 (six) hours as needed for headache. 30 capsule 0  . escitalopram (LEXAPRO) 20 MG tablet Take 1 tablet (  20 mg total) by mouth daily. 90 tablet 1  . levonorgestrel (MIRENA) 20 MCG/24HR IUD 1 each by Intrauterine route once.     No current facility-administered medications on file prior to visit.    Review of Systems:  As per HPI- otherwise negative.   Physical Examination: Vitals:   11/09/20 0940  BP: 118/80  Pulse: 67  Resp: 16  SpO2: 98%   Vitals:   11/09/20 0940  Weight: 162 lb (73.5 kg)  Height: 5\' 8"  (1.727 m)   Body mass index is 24.63 kg/m. Ideal Body Weight: Weight in (lb) to have BMI = 25: 164.1  GEN: no acute distress.  Looks well, normal weight  HEENT: Atraumatic, Normocephalic.   Bilateral TM wnl, oropharynx normal.  PEERL,EOMI.   Ears and Nose: No external deformity. CV: RRR, No M/G/R. No JVD. No thrill. No extra heart sounds. PULM: CTA B, no wheezes,  crackles, rhonchi. No retractions. No resp. distress. No accessory muscle use. ABD: S, NT, ND, +BS. No rebound. No HSM. EXTR: No c/c/e PSYCH: Normally interactive. Conversant.    Assessment and Plan: Physical exam  Screening, deficiency anemia, iron - Plan: CBC  Screening for diabetes mellitus - Plan: Comprehensive metabolic panel, Hemoglobin A1c  Screening for hyperlipidemia - Plan: Lipid panel  Fatigue, unspecified type - Plan: TSH, VITAMIN D 25 Hydroxy (Vit-D Deficiency, Fractures)  Encounter for hepatitis C screening test for low risk patient - Plan: Hepatitis C antibody  Here today for physical exam [name] [name] is doing overall well, her husband's illness is stable We discussed her memory concerns, offered to refer her to neurology.  She declines the time being, will monitor her symptoms Will plan further follow- up pending labs.  This visit occurred during the SARS-CoV-2 public health emergency.  Safety protocols were in place, including screening questions prior to the visit, additional usage of staff PPE, and extensive cleaning of exam room while observing appropriate contact time as indicated for disinfecting solutions.    Signed Aram Beecham, MD  Received her labs as below, message to patient  Results for orders placed or performed in visit on 11/09/20  CBC  Result Value Ref Range   WBC 5.0 4.0 - 10.5 K/uL   RBC 5.32 (H) 3.87 - 5.11 Mil/uL   Platelets 230.0 150.0 - 400.0 K/uL   Hemoglobin 15.2 (H) 12.0 - 15.0 g/dL   HCT 11/11/20 18.5 - 63.1 %   MCV 85.3 78.0 - 100.0 fl   MCHC 33.6 30.0 - 36.0 g/dL   RDW 49.7 02.6 - 37.8 %  Comprehensive metabolic panel  Result Value Ref Range   Sodium 135 135 - 145 mEq/L   Potassium 4.5 3.5 - 5.1 mEq/L   Chloride 101 96 - 112 mEq/L   CO2 28 19 - 32 mEq/L   Glucose, Bld 88 70 - 99 mg/dL   BUN 16 6 - 23 mg/dL   Creatinine, Ser 58.8 0.40 - 1.20 mg/dL   Total Bilirubin 0.4 0.2 - 1.2 mg/dL   Alkaline Phosphatase 32 (L) 39 - 117  U/L   AST 19 0 - 37 U/L   ALT 21 0 - 35 U/L   Total Protein 7.3 6.0 - 8.3 g/dL   Albumin 4.5 3.5 - 5.2 g/dL   GFR 5.02 774.12 mL/min   Calcium 9.6 8.4 - 10.5 mg/dL  Hemoglobin >87.86  Result Value Ref Range   Hgb A1c MFr Bld 5.2 4.6 - 6.5 %  Lipid panel  Result Value Ref Range  Cholesterol 194 0 - 200 mg/dL   Triglycerides 25.8 0.0 - 149.0 mg/dL   HDL 52.77 >82.42 mg/dL   VLDL 35.3 0.0 - 61.4 mg/dL   LDL Cholesterol 431 (H) 0 - 99 mg/dL   Total CHOL/HDL Ratio 3    NonHDL 133.00   TSH  Result Value Ref Range   TSH 1.30 0.35 - 4.50 uIU/mL  VITAMIN D 25 Hydroxy (Vit-D Deficiency, Fractures)  Result Value Ref Range   VITD 42.91 30.00 - 100.00 ng/mL

## 2020-11-04 NOTE — Patient Instructions (Addendum)
It was good to see you again today- I will be in touch with your labs asap   So glad to hear your husband is doing ok!  Please plan to see me in a year assuming all is well If you would like to see a neurologist about memory concerns and headaches we can make this happen   Health Maintenance, Female Adopting a healthy lifestyle and getting preventive care are important in promoting health and wellness. Ask your health care provider about:  The right schedule for you to have regular tests and exams.  Things you can do on your own to prevent diseases and keep yourself healthy. What should I know about diet, weight, and exercise? Eat a healthy diet   Eat a diet that includes plenty of vegetables, fruits, low-fat dairy products, and lean protein.  Do not eat a lot of foods that are high in solid fats, added sugars, or sodium. Maintain a healthy weight Body mass index (BMI) is used to identify weight problems. It estimates body fat based on height and weight. Your health care provider can help determine your BMI and help you achieve or maintain a healthy weight. Get regular exercise Get regular exercise. This is one of the most important things you can do for your health. Most adults should:  Exercise for at least 150 minutes each week. The exercise should increase your heart rate and make you sweat (moderate-intensity exercise).  Do strengthening exercises at least twice a week. This is in addition to the moderate-intensity exercise.  Spend less time sitting. Even light physical activity can be beneficial. Watch cholesterol and blood lipids Have your blood tested for lipids and cholesterol at 45 years of age, then have this test every 5 years. Have your cholesterol levels checked more often if:  Your lipid or cholesterol levels are high.  You are older than 45 years of age.  You are at high risk for heart disease. What should I know about cancer screening? Depending on your health  history and family history, you may need to have cancer screening at various ages. This may include screening for:  Breast cancer.  Cervical cancer.  Colorectal cancer.  Skin cancer.  Lung cancer. What should I know about heart disease, diabetes, and high blood pressure? Blood pressure and heart disease  High blood pressure causes heart disease and increases the risk of stroke. This is more likely to develop in people who have high blood pressure readings, are of African descent, or are overweight.  Have your blood pressure checked: ? Every 3-5 years if you are 31-35 years of age. ? Every year if you are 51 years old or older. Diabetes Have regular diabetes screenings. This checks your fasting blood sugar level. Have the screening done:  Once every three years after age 38 if you are at a normal weight and have a low risk for diabetes.  More often and at a younger age if you are overweight or have a high risk for diabetes. What should I know about preventing infection? Hepatitis B If you have a higher risk for hepatitis B, you should be screened for this virus. Talk with your health care provider to find out if you are at risk for hepatitis B infection. Hepatitis C Testing is recommended for:  Everyone born from [birth year] through 1966.  Anyone with known risk factors for hepatitis C. Sexually transmitted infections (STIs)  Get screened for STIs, including gonorrhea and chlamydia, if: ? You are sexually active  and are younger than 45 years of age. ? You are older than 45 years of age and your health care provider tells you that you are at risk for this type of infection. ? Your sexual activity has changed since you were last screened, and you are at increased risk for chlamydia or gonorrhea. Ask your health care provider if you are at risk.  Ask your health care provider about whether you are at high risk for HIV. Your health care provider may recommend a prescription medicine to  help prevent HIV infection. If you choose to take medicine to prevent HIV, you should first get tested for HIV. You should then be tested every 3 months for as long as you are taking the medicine. Pregnancy  If you are about to stop having your period (premenopausal) and you may become pregnant, seek counseling before you get pregnant.  Take 400 to 800 micrograms (mcg) of folic acid every day if you become pregnant.  Ask for birth control (contraception) if you want to prevent pregnancy. Osteoporosis and menopause Osteoporosis is a disease in which the bones lose minerals and strength with aging. This can result in bone fractures. If you are 25 years old or older, or if you are at risk for osteoporosis and fractures, ask your health care provider if you should:  Be screened for bone loss.  Take a calcium or vitamin D supplement to lower your risk of fractures.  Be given hormone replacement therapy (HRT) to treat symptoms of menopause. Follow these instructions at home: Lifestyle  Do not use any products that contain nicotine or tobacco, such as cigarettes, e-cigarettes, and chewing tobacco. If you need help quitting, ask your health care provider.  Do not use street drugs.  Do not share needles.  Ask your health care provider for help if you need support or information about quitting drugs. Alcohol use  Do not drink alcohol if: ? Your health care provider tells you not to drink. ? You are pregnant, may be pregnant, or are planning to become pregnant.  If you drink alcohol: ? Limit how much you use to 0-1 drink a day. ? Limit intake if you are breastfeeding.  Be aware of how much alcohol is in your drink. In the [place] one drink equals one 12 oz bottle of beer (355 mL), one 5 oz glass of wine (148 mL), or one 1 oz glass of hard liquor (44 mL). General instructions  Schedule regular health, dental, and eye exams.  Stay current with your vaccines.  Tell your health care  provider if: ? You often feel depressed. ? You have ever been abused or do not feel safe at home. Summary  Adopting a healthy lifestyle and getting preventive care are important in promoting health and wellness.  Follow your health care provider's instructions about healthy diet, exercising, and getting tested or screened for diseases.  Follow your health care provider's instructions on monitoring your cholesterol and blood pressure. This information is not intended to replace advice given to you by your health care provider. Make sure you discuss any questions you have with your health care provider. Document Revised: 10/29/2018 Document Reviewed: 10/29/2018 Elsevier Patient Education  2020 [employer].

## 2020-11-08 ENCOUNTER — Other Ambulatory Visit: Payer: Self-pay

## 2020-11-08 ENCOUNTER — Ambulatory Visit
Admission: RE | Admit: 2020-11-08 | Discharge: 2020-11-08 | Disposition: A | Discharge status: Home / Self Care | Payer: BC Managed Care – PPO | Source: Ambulatory Visit | Attending: Obstetrics and Gynecology | Admitting: Obstetrics and Gynecology

## 2020-11-08 DIAGNOSIS — R921 Mammographic calcification found on diagnostic imaging of breast: Secondary | ICD-10-CM

## 2020-11-09 ENCOUNTER — Encounter: Payer: Self-pay | Admitting: Family Medicine

## 2020-11-09 ENCOUNTER — Ambulatory Visit (INDEPENDENT_AMBULATORY_CARE_PROVIDER_SITE_OTHER): Payer: BC Managed Care – PPO | Admitting: Family Medicine

## 2020-11-09 VITALS — BP 118/80 | HR 67 | Resp 16 | Ht 68.0 in | Wt 162.0 lb

## 2020-11-09 DIAGNOSIS — Z1159 Encounter for screening for other viral diseases: Secondary | ICD-10-CM

## 2020-11-09 DIAGNOSIS — Z131 Encounter for screening for diabetes mellitus: Secondary | ICD-10-CM | POA: Diagnosis not present

## 2020-11-09 DIAGNOSIS — Z130 Encounter for screening for diseases of the blood and blood-forming organs and certain disorders involving the immune mechanism: Secondary | ICD-10-CM

## 2020-11-09 DIAGNOSIS — Z0000 Encounter for general adult medical examination without abnormal findings: Secondary | ICD-10-CM

## 2020-11-09 DIAGNOSIS — R5383 Other fatigue: Secondary | ICD-10-CM | POA: Diagnosis not present

## 2020-11-09 DIAGNOSIS — Z13220 Encounter for screening for lipoid disorders: Secondary | ICD-10-CM | POA: Diagnosis not present

## 2020-11-09 LAB — COMPREHENSIVE METABOLIC PANEL
ALT: 21 U/L (ref 0–35)
AST: 19 U/L (ref 0–37)
Albumin: 4.5 g/dL (ref 3.5–5.2)
Alkaline Phosphatase: 32 U/L — ABNORMAL LOW (ref 39–117)
BUN: 16 mg/dL (ref 6–23)
CO2: 28 mEq/L (ref 19–32)
Calcium: 9.6 mg/dL (ref 8.4–10.5)
Chloride: 101 mEq/L (ref 96–112)
Creatinine, Ser: 0.71 mg/dL (ref 0.40–1.20)
GFR: 102.79 mL/min (ref >60.00)
Glucose, Bld: 88 mg/dL (ref 70–99)
Potassium: 4.5 mEq/L (ref 3.5–5.1)
Sodium: 135 mEq/L (ref 135–145)
Total Bilirubin: 0.4 mg/dL (ref 0.2–1.2)
Total Protein: 7.3 g/dL (ref 6.0–8.3)

## 2020-11-09 LAB — CBC
HCT: 45.3 % (ref 36.0–46.0)
Hemoglobin: 15.2 g/dL — ABNORMAL HIGH (ref 12.0–15.0)
MCHC: 33.6 g/dL (ref 30.0–36.0)
MCV: 85.3 fl (ref 78.0–100.0)
Platelets: 230 10*3/uL (ref 150.0–400.0)
RBC: 5.32 Mil/uL — ABNORMAL HIGH (ref 3.87–5.11)
RDW: 13.2 % (ref 11.5–15.5)
WBC: 5 10*3/uL (ref 4.0–10.5)

## 2020-11-09 LAB — TSH: TSH: 1.3 u[IU]/mL (ref 0.35–4.50)

## 2020-11-09 LAB — LIPID PANEL
Cholesterol: 194 mg/dL (ref 0–200)
HDL: 61.1 mg/dL (ref >39.00)
LDL Cholesterol: 121 mg/dL — ABNORMAL HIGH (ref 0–99)
NonHDL: 133
Total CHOL/HDL Ratio: 3
Triglycerides: 60 mg/dL (ref 0.0–149.0)
VLDL: 12 mg/dL (ref 0.0–40.0)

## 2020-11-09 LAB — HEMOGLOBIN A1C: Hgb A1c MFr Bld: 5.2 % (ref 4.6–6.5)

## 2020-11-09 LAB — VITAMIN D 25 HYDROXY (VIT D DEFICIENCY, FRACTURES): VITD: 42.91 ng/mL (ref 30.00–100.00)

## 2020-11-10 LAB — HEPATITIS C ANTIBODY
Hepatitis C Ab: NONREACTIVE
SIGNAL TO CUT-OFF: 0 (ref <1.00)

## 2020-11-15 ENCOUNTER — Encounter: Payer: Self-pay | Admitting: Family Medicine

## 2020-12-21 ENCOUNTER — Other Ambulatory Visit: Payer: Self-pay

## 2020-12-21 ENCOUNTER — Ambulatory Visit
Admission: RE | Admit: 2020-12-21 | Discharge: 2020-12-21 | Disposition: A | Discharge status: Home / Self Care | Payer: BC Managed Care – PPO | Source: Ambulatory Visit | Attending: Obstetrics and Gynecology | Admitting: Obstetrics and Gynecology

## 2021-02-22 IMAGING — MG DIGITAL DIAGNOSTIC BILAT W/ TOMO W/ CAD
6 of 10 series · 6 of 26 positions shown · non-contrast
Comparison: Previous exam(s).

CLINICAL DATA: Follow-up for probably benign calcifications in the
LEFT breast. These calcifications were originally identified in
9183.

EXAM:
DIGITAL DIAGNOSTIC BILATERAL MAMMOGRAM WITH TOMO AND CAD
TECHNIQUE: Bilateral digital diagnostic mammography and breast tomosynthesis
was performed. Digital images of the breasts were evaluated with
computer-aided detection.

[L ML]
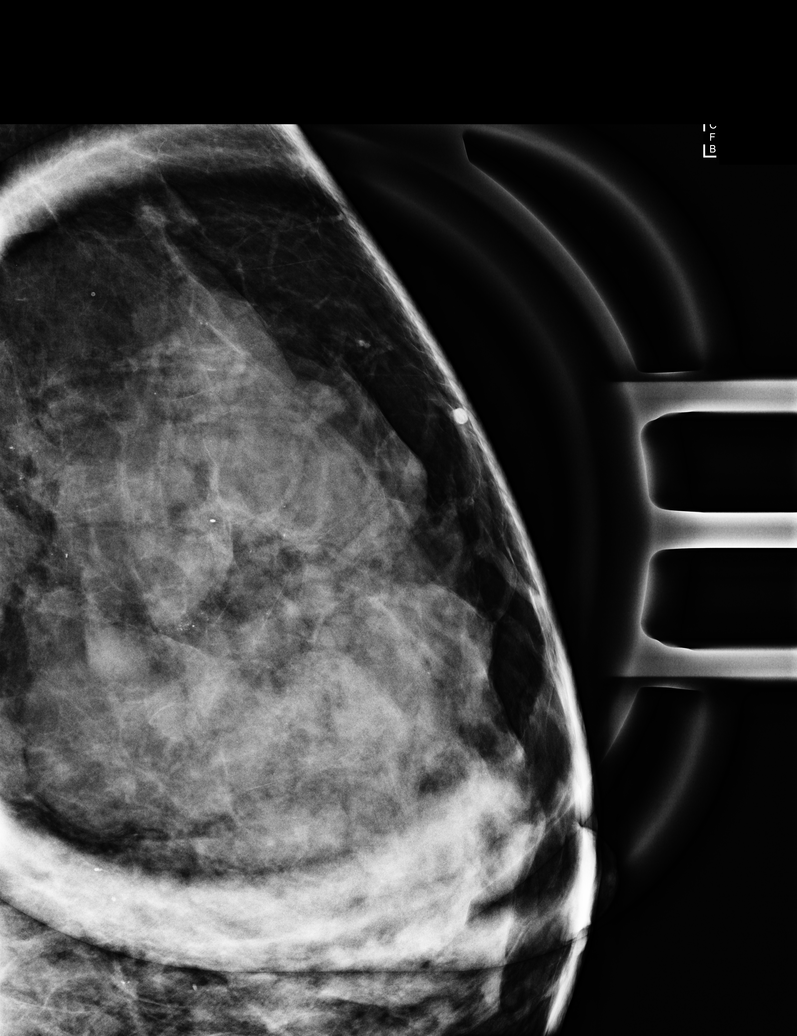

[L CC]
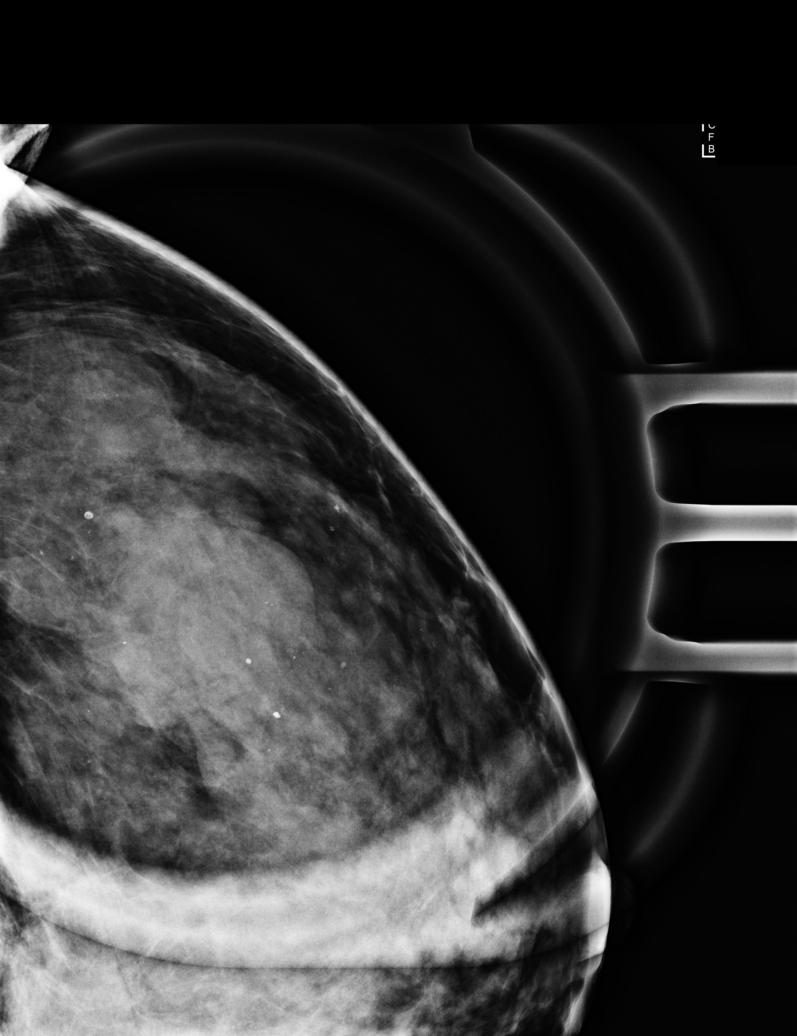

[L MLO synth-2D]
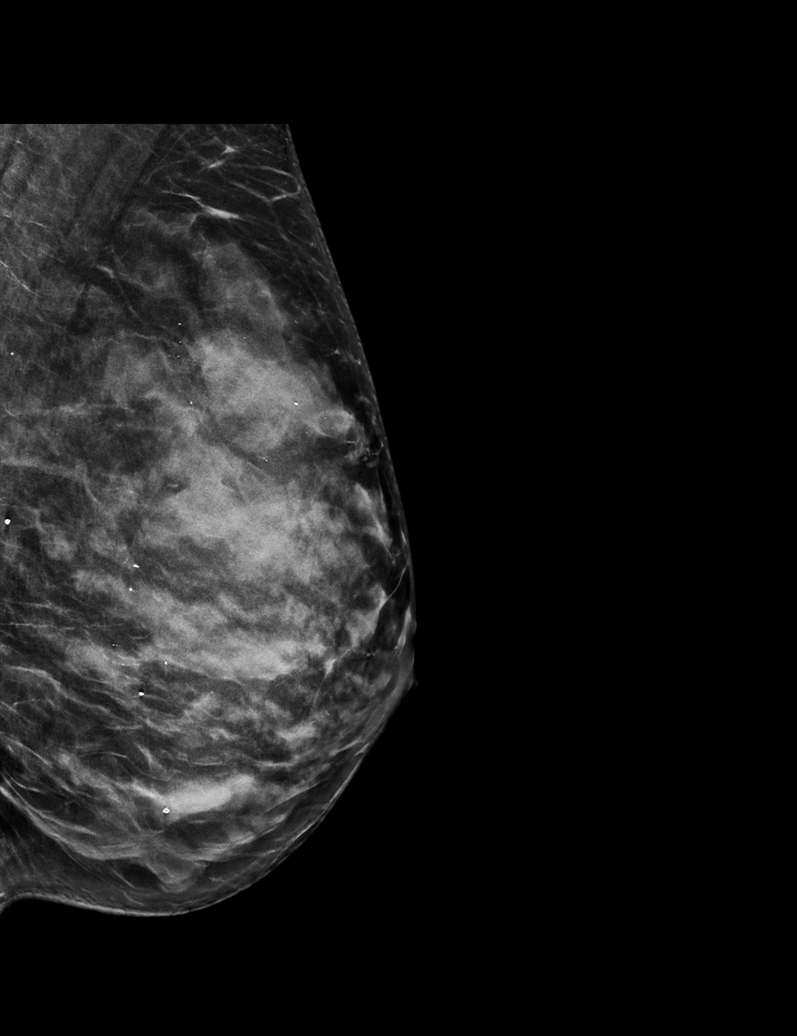

[R MLO synth-2D]
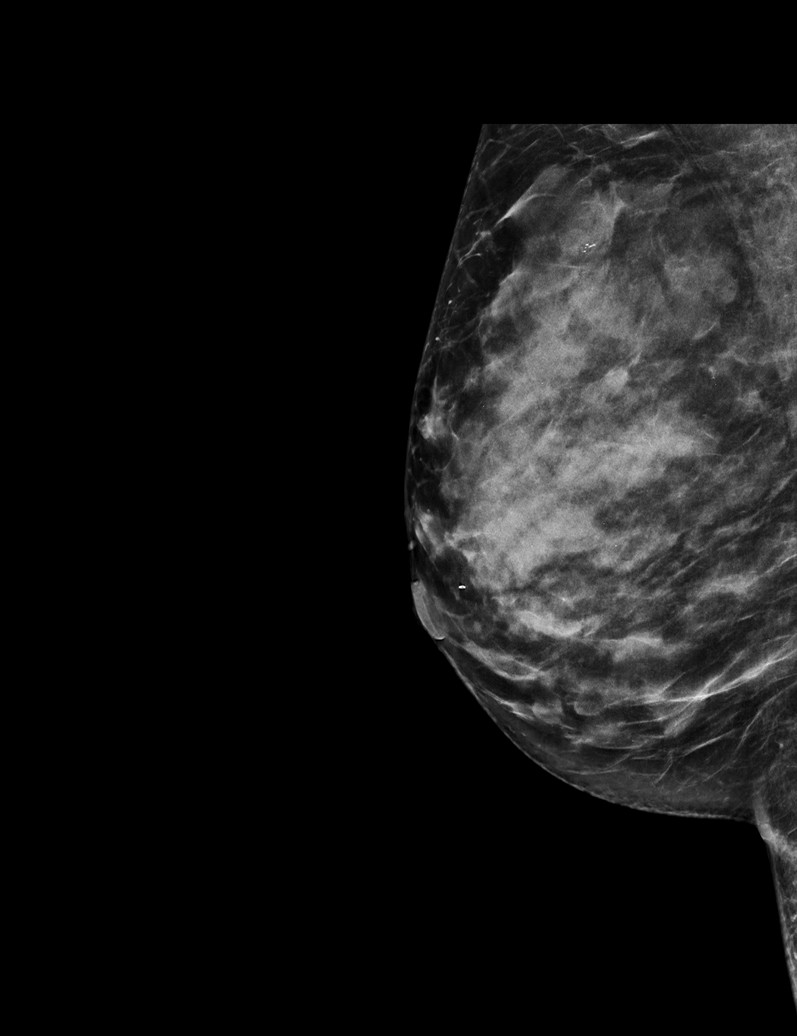

[L CC synth-2D]
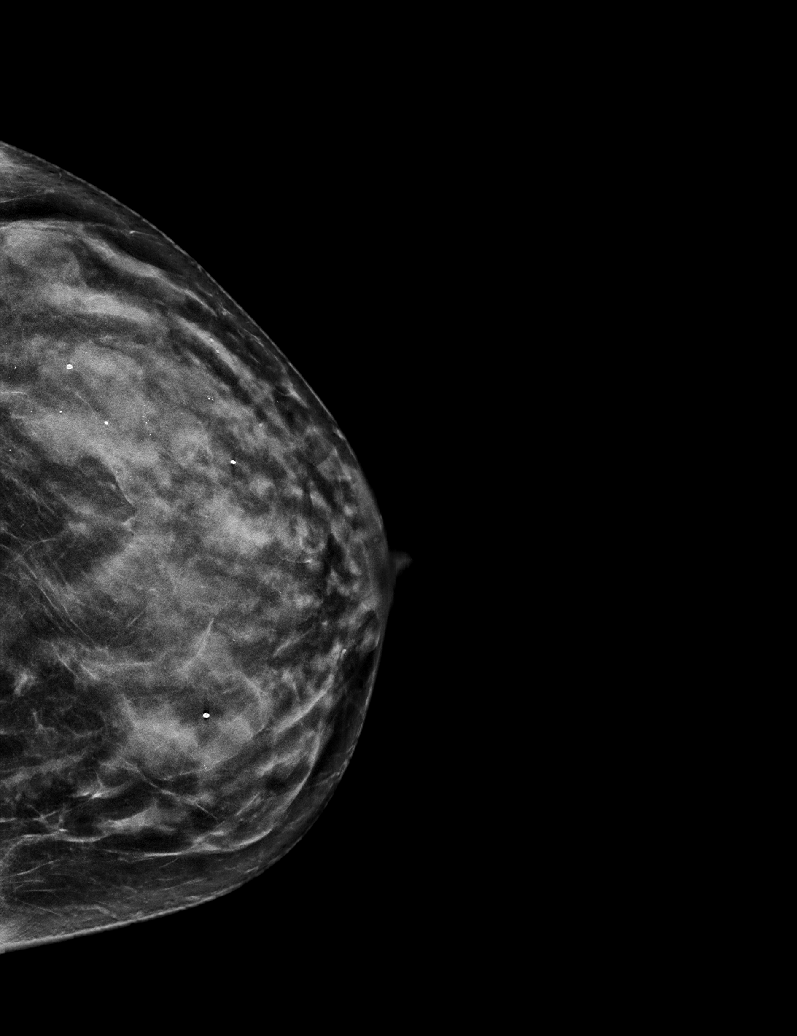

[R CC synth-2D]
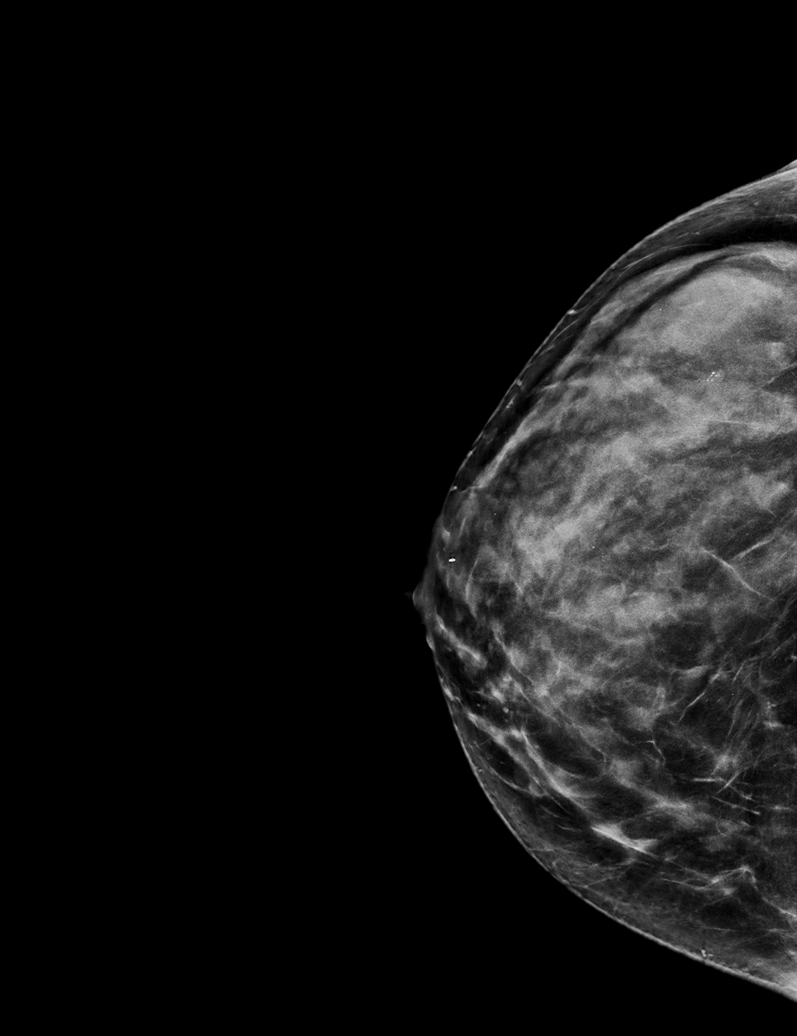

[6 of 26 positions shown; findings below may reference images not displayed]

ACR Breast Density Category c: The breast tissue is heterogeneously
dense, which may obscure small masses.
FINDINGS: The LEFT breast calcifications are not significantly changed for 2
years, confirming benignity. There are no new dominant masses,
suspicious calcifications or secondary signs of malignancy within
either breast.
IMPRESSION: No evidence of malignancy within either breast. Stable benign
calcifications in the LEFT breast.

Patient may return to routine annual bilateral screening mammogram
schedule.

RECOMMENDATION:
Screening mammogram in one year.(Code:[email or web address])

I have discussed the findings and recommendations with the patient.
If applicable, a reminder letter will be sent to the patient
regarding the next appointment.

BI-RADS CATEGORY  2: Benign.

## 2021-03-04 ENCOUNTER — Other Ambulatory Visit: Payer: Self-pay | Admitting: Family Medicine

## 2021-06-02 ENCOUNTER — Other Ambulatory Visit: Payer: Self-pay | Admitting: Family Medicine

## 2021-09-12 ENCOUNTER — Encounter: Payer: Self-pay | Admitting: Family

## 2021-09-12 ENCOUNTER — Other Ambulatory Visit: Payer: Self-pay

## 2021-09-12 ENCOUNTER — Ambulatory Visit: Payer: BC Managed Care – PPO | Admitting: Family

## 2021-09-12 VITALS — BP 130/80 | HR 66 | Temp 97.8°F | Ht 68.0 in | Wt 166.6 lb

## 2021-09-12 DIAGNOSIS — J069 Acute upper respiratory infection, unspecified: Secondary | ICD-10-CM | POA: Diagnosis not present

## 2021-09-12 DIAGNOSIS — J029 Acute pharyngitis, unspecified: Secondary | ICD-10-CM

## 2021-09-12 LAB — POCT INFLUENZA A/B
Influenza A, POC: NEGATIVE
Influenza B, POC: NEGATIVE

## 2021-09-12 LAB — POCT RAPID STREP A (OFFICE): Rapid Strep A Screen: NEGATIVE

## 2021-09-12 MED ORDER — AZITHROMYCIN 250 MG PO TABS: ORAL_TABLET | ORAL | 0 refills | Status: DC

## 2021-09-12 NOTE — Progress Notes (Signed)
[name] [name] is a 45 y.o. female with the following history as recorded in Epic[facility]:  Patient Active Problem List   Diagnosis Date Noted   Migraine 01/18/2016   Stress incontinence 08/06/2014   Depression 08/06/2014   Other malaise and fatigue 08/06/2014   GERD (gastroesophageal reflux disease) 08/06/2014   Seasonal allergies 08/06/2014   Overweight (BMI 25.0-29.9) 08/06/2014    Current Outpatient Medications  Medication Sig Dispense Refill   ALPRAZolam (XANAX) 0.25 MG tablet Take 1 tablet (0.25 mg total) by mouth 3 (three) times daily as needed for anxiety. 30 tablet 1   azithromycin (ZITHROMAX) 250 MG tablet 2 tabs po qd x 1 day; 1 tablet per day x 4 days; 6 tablet 0   butalbital-aspirin-caffeine (FIORINAL) 50-325-40 MG capsule Take 1 capsule by mouth every 6 (six) hours as needed for headache. 30 capsule 0   escitalopram (LEXAPRO) 20 MG tablet TAKE 1 TABLET(20 MG) BY MOUTH DAILY 90 tablet 0   levonorgestrel (MIRENA) 20 MCG/24HR IUD 1 each by Intrauterine route once.     No current facility-administered medications for this visit.    Allergies: Phenergan [promethazine hcl] and Omnicef [cefdinir]  Past Medical History:  Diagnosis Date   Anxiety    Asthma    childhood   Depression    Diverticulitis    HSV-2 infection    Hypertension    white coat   Migraine     Past Surgical History:  Procedure Laterality Date   APPENDECTOMY     INSERTION OF MESH     2017 bladder   LEEP      Family History  Problem Relation Age of Onset   Hypertension Mother    Cancer Mother    Crohn's disease Father    Depression Sister    Depression Brother     Social History   Tobacco Use   Smoking status: Never   Smokeless tobacco: Never  Substance Use Topics   Alcohol use: Yes    Subjective:   2 week history of sinus/ nasal congestion; concerned that in past few days she developed new onset sore throat/ body aches; works as a Public relations account executive and was concerned for possible  flu exposure; no fever or chest pain or shortness of breath; negative COVID test last night;    Objective:  Vitals:   09/12/21 1539  BP: 130/80  Pulse: 66  Temp: 97.8 F (36.6 C)  TempSrc: Oral  SpO2: 98%  Weight: 166 lb 9.6 oz (75.6 kg)  Height: 5\' 8"  (1.727 m)    General: Well developed, well nourished, in no acute distress  Skin : Warm and dry.  Head: Normocephalic and atraumatic  Eyes: Sclera and conjunctiva clear; pupils round and reactive to light; extraocular movements intact  Ears: External normal; canals clear; tympanic membranes normal  Oropharynx: Pink, supple. No suspicious lesions  Neck: Supple without thyromegaly, adenopathy  Lungs: Respirations unlabored; clear to auscultation bilaterally without wheeze, rales, rhonchi  CVS exam: normal rate and regular rhythm.  Neurologic: Alert and oriented; speech intact; face symmetrical; moves all extremities well; CNII-XII intact without focal deficit   Assessment:  1. Sore throat   2. Acute URI     Plan:  Rapid flu and rapid strep are both negative; Rx for Z-pak #1 take as directed; increase fluids, rest and follow up worse, no better.   This visit occurred during the SARS-CoV-2 public health emergency.  Safety protocols were in place, including screening questions prior to the visit, additional usage  of staff PPE, and extensive cleaning of exam room while observing appropriate contact time as indicated for disinfecting solutions.    No follow-ups on file.  No orders of the defined types were placed in this encounter.   Requested Prescriptions   Signed Prescriptions Disp Refills   azithromycin (ZITHROMAX) 250 MG tablet 6 tablet 0    Sig: 2 tabs po qd x 1 day; 1 tablet per day x 4 days;

## 2021-09-12 NOTE — Addendum Note (Signed)
Addended by: Lisbeth Renshaw, Donn Zanetti HUA on: 09/12/2021 04:33 PM   Modules accepted: Orders

## 2021-09-26 ENCOUNTER — Other Ambulatory Visit: Payer: Self-pay | Admitting: Family Medicine

## 2021-10-09 ENCOUNTER — Encounter: Payer: Self-pay | Admitting: Family Medicine

## 2021-12-21 ENCOUNTER — Other Ambulatory Visit: Payer: Self-pay | Admitting: Family Medicine

## 2022-01-05 ENCOUNTER — Other Ambulatory Visit: Payer: Self-pay | Admitting: Family Medicine

## 2022-01-12 ENCOUNTER — Ambulatory Visit: Payer: BC Managed Care – PPO | Admitting: Family Medicine

## 2022-01-12 ENCOUNTER — Encounter: Payer: Self-pay | Admitting: Family Medicine

## 2022-01-12 NOTE — Telephone Encounter (Signed)
Called pt to try to offer [employer] 3p m slot- had to leave a VM, no success on the "home" number listed either.

## 2022-01-12 NOTE — Progress Notes (Incomplete)
Subjective:   By signing my name below, I, Zite Okoli, attest that this documentation has been prepared under the direction and in the presence of Ann Held, DO. 01/12/2022    Patient ID: [name] [name], female    DOB: November 14, 1976, 46 y.o.   MRN: [id number]  No chief complaint on file.   HPI Patient is in today for an office visit  Past Medical History:  Diagnosis Date   Anxiety    Asthma    childhood   Depression    Diverticulitis    HSV-2 infection    Hypertension    white coat   Migraine     Past Surgical History:  Procedure Laterality Date   APPENDECTOMY     INSERTION OF MESH     2017 bladder   LEEP      Family History  Problem Relation Age of Onset   Hypertension Mother    Cancer Mother    Crohn's disease Father    Depression Sister    Depression Brother     Social History   Socioeconomic History   Marital status: Married    Spouse name: Not on file   Number of children: 2   Years of education: masters   Highest education level: Not on file  Occupational History   Not on file  Tobacco Use   Smoking status: Never   Smokeless tobacco: Never  Substance and Sexual Activity   Alcohol use: Yes   Drug use: No   Sexual activity: Not on file  Other Topics Concern   Not on file  Social History Narrative   Drinks 2 cups of coffee a day    Social Determinants of Health   Financial Resource Strain: Not on file  Food Insecurity: Not on file  Transportation Needs: Not on file  Physical Activity: Not on file  Stress: Not on file  Social Connections: Not on file  Intimate Partner Violence: Not on file    Outpatient Medications Prior to Visit  Medication Sig Dispense Refill   ALPRAZolam (XANAX) 0.25 MG tablet Take 1 tablet (0.25 mg total) by mouth 3 (three) times daily as needed for anxiety. 30 tablet 1   azithromycin (ZITHROMAX) 250 MG tablet 2 tabs po qd x 1 day; 1 tablet per day x 4 days; 6 tablet 0    butalbital-aspirin-caffeine (FIORINAL) 50-325-40 MG capsule Take 1 capsule by mouth every 6 (six) hours as needed for headache. 30 capsule 0   escitalopram (LEXAPRO) 20 MG tablet TAKE 1 TABLET(20 MG) BY MOUTH DAILY 90 tablet 0   levonorgestrel (MIRENA) 20 MCG/24HR IUD 1 each by Intrauterine route once.     No facility-administered medications prior to visit.    Allergies  Allergen Reactions   Phenergan [Promethazine Hcl] Other (See Comments)    Pt advised that she was given a nausea medication when she was 17 caused her to like she could not move. Unsure if this was the medication.    Omnicef [Cefdinir] Rash    Review of Systems  Constitutional:  Negative for fever.  HENT:  Negative for congestion, ear pain, hearing loss, sinus pain and sore throat.   Eyes:  Negative for blurred vision and pain.  Respiratory:  Negative for cough, sputum production, shortness of breath and wheezing.   Cardiovascular:  Negative for chest pain and palpitations.  Gastrointestinal:  Negative for blood in stool, constipation, diarrhea, nausea and vomiting.  Genitourinary:  Negative for dysuria, frequency, hematuria and urgency.  Musculoskeletal:  Negative for back pain, falls and myalgias.  Neurological:  Negative for dizziness, sensory change, loss of consciousness, weakness and headaches.  Endo/Heme/Allergies:  Negative for environmental allergies. Does not bruise/bleed easily.  Psychiatric/Behavioral:  Negative for depression and suicidal ideas. The patient is not nervous/anxious and does not have insomnia.       Objective:    Physical Exam Constitutional:      General: She is not in acute distress.    Appearance: Normal appearance. She is not ill-appearing.  HENT:     Head: Normocephalic and atraumatic.     Right Ear: External ear normal.     Left Ear: External ear normal.  Eyes:     Extraocular Movements: Extraocular movements intact.     Pupils: Pupils are equal, round, and reactive to light.   Cardiovascular:     Rate and Rhythm: Normal rate and regular rhythm.     Pulses: Normal pulses.     Heart sounds: Normal heart sounds. No murmur heard.   No gallop.  Pulmonary:     Effort: Pulmonary effort is normal. No respiratory distress.     Breath sounds: Normal breath sounds. No wheezing, rhonchi or rales.  Abdominal:     General: Bowel sounds are normal. There is no distension.     Palpations: Abdomen is soft. There is no mass.     Tenderness: There is no abdominal tenderness. There is no guarding or rebound.     Hernia: No hernia is present.  Musculoskeletal:     Cervical back: Normal range of motion and neck supple.  Lymphadenopathy:     Cervical: No cervical adenopathy.  Skin:    General: Skin is warm and dry.  Neurological:     Mental Status: She is alert and oriented to person, place, and time.  Psychiatric:        Behavior: Behavior normal.    There were no vitals taken for this visit. Wt Readings from Last 3 Encounters:  09/12/21 166 lb 9.6 oz (75.6 kg)  11/09/20 162 lb (73.5 kg)  01/06/20 162 lb 12.8 oz (73.8 kg)    Diabetic Foot Exam - Simple   No data filed    Lab Results  Component Value Date   WBC 5.0 11/09/2020   HGB 15.2 (H) 11/09/2020   HCT 45.3 11/09/2020   PLT 230.0 11/09/2020   GLUCOSE 88 11/09/2020   CHOL 194 11/09/2020   TRIG 60.0 11/09/2020   HDL 61.10 11/09/2020   LDLCALC 121 (H) 11/09/2020   ALT 21 11/09/2020   AST 19 11/09/2020   NA 135 11/09/2020   K 4.5 11/09/2020   CL 101 11/09/2020   CREATININE 0.71 11/09/2020   BUN 16 11/09/2020   CO2 28 11/09/2020   TSH 1.30 11/09/2020   HGBA1C 5.2 11/09/2020    Lab Results  Component Value Date   TSH 1.30 11/09/2020   Lab Results  Component Value Date   WBC 5.0 11/09/2020   HGB 15.2 (H) 11/09/2020   HCT 45.3 11/09/2020   MCV 85.3 11/09/2020   PLT 230.0 11/09/2020   Lab Results  Component Value Date   NA 135 11/09/2020   K 4.5 11/09/2020   CO2 28 11/09/2020   GLUCOSE 88  11/09/2020   BUN 16 11/09/2020   CREATININE 0.71 11/09/2020   BILITOT 0.4 11/09/2020   ALKPHOS 32 (L) 11/09/2020   AST 19 11/09/2020   ALT 21 11/09/2020   PROT 7.3 11/09/2020   ALBUMIN 4.5 11/09/2020  CALCIUM 9.6 11/09/2020   GFR 102.79 11/09/2020   Lab Results  Component Value Date   CHOL 194 11/09/2020   Lab Results  Component Value Date   HDL 61.10 11/09/2020   Lab Results  Component Value Date   LDLCALC 121 (H) 11/09/2020   Lab Results  Component Value Date   TRIG 60.0 11/09/2020   Lab Results  Component Value Date   CHOLHDL 3 11/09/2020   Lab Results  Component Value Date   HGBA1C 5.2 11/09/2020       Assessment & Plan:   Problem List Items Addressed This Visit   None   No orders of the defined types were placed in this encounter.   I,Zite Okoli,acting as a Education administrator for [employer], DO.,have documented all relevant documentation on the behalf of Ann Held, DO,as directed by  Ann Held, DO while in the presence of Ann Held, [place], DO., personally preformed the services described in this documentation.  All medical record entries made by the scribe were at my direction and in my presence.  I have reviewed the chart and discharge instructions (if applicable) and agree that the record reflects my personal performance and is accurate and complete. 01/12/2022

## 2022-04-05 ENCOUNTER — Telehealth: Payer: BC Managed Care – PPO | Admitting: Family Medicine

## 2022-04-05 DIAGNOSIS — J069 Acute upper respiratory infection, unspecified: Secondary | ICD-10-CM | POA: Diagnosis not present

## 2022-04-05 MED ORDER — FLUTICASONE PROPIONATE 50 MCG/ACT NA SUSP: 2.0000 | Freq: Every day | NASAL | 0 refills | Status: AC

## 2022-04-05 NOTE — Progress Notes (Signed)

## 2022-04-06 ENCOUNTER — Other Ambulatory Visit: Payer: Self-pay | Admitting: Family Medicine

## 2022-06-27 LAB — HM COLONOSCOPY

## 2022-07-05 ENCOUNTER — Encounter: Payer: Self-pay | Admitting: Family Medicine

## 2022-07-05 ENCOUNTER — Other Ambulatory Visit: Payer: Self-pay | Admitting: Family Medicine

## 2022-08-04 ENCOUNTER — Other Ambulatory Visit: Payer: Self-pay | Admitting: Family Medicine

## 2022-08-17 NOTE — Progress Notes (Unsigned)
[facility] [address] [place], [place] [address] [phone]  Date:  08/20/2022   Name:  Charsie Steimel   DOB:  [birth year]   MRN:  [id number]  PCP:  Darreld Mclean, MD    Chief Complaint: No chief complaint on file.   History of Present Illness:  Nastasha Victoria is a 46 y.o. very pleasant female patient who presents with the following:  Patient seen today for physical exam Most recent visit with myself was in 2021-time her husband was dealing with a brain tumor, status post chemotherapy  Generally healthy woman with history of migraine, GERD, depression  COVID booster Tetanus today  Patient Active Problem List   Diagnosis Date Noted   Migraine 01/18/2016   Stress incontinence 08/06/2014   Depression 08/06/2014   Other malaise and fatigue 08/06/2014   GERD (gastroesophageal reflux disease) 08/06/2014   Seasonal allergies 08/06/2014   Overweight (BMI 25.0-29.9) 08/06/2014    Past Medical History:  Diagnosis Date   Anxiety    Asthma    childhood   Depression    Diverticulitis    HSV-2 infection    Hypertension    white coat   Migraine     Past Surgical History:  Procedure Laterality Date   APPENDECTOMY     INSERTION OF MESH     2017 bladder   LEEP      Social History   Tobacco Use   Smoking status: Never   Smokeless tobacco: Never  Substance Use Topics   Alcohol use: Yes   Drug use: No    Family History  Problem Relation Age of Onset   Hypertension Mother    Cancer Mother    Crohn's disease Father    Depression Sister    Depression Brother     Allergies  Allergen Reactions   Phenergan [Promethazine Hcl] Other (See Comments)    Pt advised that she was given a nausea medication when she was 17 caused her to like she could not move. Unsure if this was the medication.    Omnicef [Cefdinir] Rash    Medication list has been reviewed and updated.  Current  Outpatient Medications on File Prior to Visit  Medication Sig Dispense Refill   ALPRAZolam (XANAX) 0.25 MG tablet Take 1 tablet (0.25 mg total) by mouth 3 (three) times daily as needed for anxiety. 30 tablet 1   azithromycin (ZITHROMAX) 250 MG tablet 2 tabs po qd x 1 day; 1 tablet per day x 4 days; 6 tablet 0   butalbital-aspirin-caffeine (FIORINAL) 50-325-40 MG capsule Take 1 capsule by mouth every 6 (six) hours as needed for headache. 30 capsule 0   escitalopram (LEXAPRO) 20 MG tablet TAKE 1 TABLET(20 MG) BY MOUTH DAILY 30 tablet 0   fluticasone (FLONASE) 50 MCG/ACT nasal spray Place 2 sprays into both nostrils daily. 16 g 0   levonorgestrel (MIRENA) 20 MCG/24HR IUD 1 each by Intrauterine route once.     No current facility-administered medications on file prior to visit.    Review of Systems:  As per HPI- otherwise negative.   Physical Examination: There were no vitals filed for this visit. There were no vitals filed for this visit. There is no height or weight on file to calculate BMI. Ideal Body Weight:    GEN: no acute distress. HEENT: Atraumatic, Normocephalic.  Ears and Nose: No external deformity. CV: RRR, No M/G/R. No JVD. No thrill. No  extra heart sounds. PULM: CTA B, no wheezes, crackles, rhonchi. No retractions. No resp. distress. No accessory muscle use. ABD: S, NT, ND, +BS. No rebound. No HSM. EXTR: No c/c/e PSYCH: Normally interactive. Conversant.    Assessment and Plan: ***  Signed Lamar Blinks, MD

## 2022-08-20 ENCOUNTER — Ambulatory Visit: Payer: BC Managed Care – PPO | Admitting: Family Medicine

## 2022-08-20 VITALS — BP 118/60 | HR 66 | Temp 97.7°F | Resp 18 | Ht 68.0 in | Wt 175.6 lb

## 2022-08-20 DIAGNOSIS — Z130 Encounter for screening for diseases of the blood and blood-forming organs and certain disorders involving the immune mechanism: Secondary | ICD-10-CM | POA: Diagnosis not present

## 2022-08-20 DIAGNOSIS — Z131 Encounter for screening for diabetes mellitus: Secondary | ICD-10-CM

## 2022-08-20 DIAGNOSIS — R5383 Other fatigue: Secondary | ICD-10-CM | POA: Diagnosis not present

## 2022-08-20 DIAGNOSIS — Z0000 Encounter for general adult medical examination without abnormal findings: Secondary | ICD-10-CM

## 2022-08-20 DIAGNOSIS — F3289 Other specified depressive episodes: Secondary | ICD-10-CM

## 2022-08-20 DIAGNOSIS — Z13220 Encounter for screening for lipoid disorders: Secondary | ICD-10-CM | POA: Diagnosis not present

## 2022-08-20 MED ORDER — ESCITALOPRAM OXALATE 20 MG PO TABS: ORAL_TABLET | ORAL | 3 refills | Status: DC

## 2022-08-20 NOTE — Patient Instructions (Signed)
It was great to see you again today! I will be in touch with your labs asap  

## 2022-08-21 ENCOUNTER — Encounter: Payer: Self-pay | Admitting: Family Medicine

## 2022-08-21 LAB — COMPREHENSIVE METABOLIC PANEL
ALT: 9 U/L (ref 0–35)
AST: 16 U/L (ref 0–37)
Albumin: 4.3 g/dL (ref 3.5–5.2)
Alkaline Phosphatase: 35 U/L — ABNORMAL LOW (ref 39–117)
BUN: 17 mg/dL (ref 6–23)
CO2: 24 mEq/L (ref 19–32)
Calcium: 9.1 mg/dL (ref 8.4–10.5)
Chloride: 103 mEq/L (ref 96–112)
Creatinine, Ser: 0.75 mg/dL (ref 0.40–1.20)
GFR: 95.05 mL/min (ref >60.00)
Glucose, Bld: 85 mg/dL (ref 70–99)
Potassium: 4 mEq/L (ref 3.5–5.1)
Sodium: 135 mEq/L (ref 135–145)
Total Bilirubin: 0.3 mg/dL (ref 0.2–1.2)
Total Protein: 7.3 g/dL (ref 6.0–8.3)

## 2022-08-21 LAB — CBC
HCT: 42.4 % (ref 36.0–46.0)
Hemoglobin: 14.5 g/dL (ref 12.0–15.0)
MCHC: 34.1 g/dL (ref 30.0–36.0)
MCV: 84.9 fl (ref 78.0–100.0)
Platelets: 210 10*3/uL (ref 150.0–400.0)
RBC: 5 Mil/uL (ref 3.87–5.11)
RDW: 12.3 % (ref 11.5–15.5)
WBC: 7.2 10*3/uL (ref 4.0–10.5)

## 2022-08-21 LAB — LIPID PANEL
Cholesterol: 221 mg/dL — ABNORMAL HIGH (ref 0–200)
HDL: 69.6 mg/dL (ref >39.00)
LDL Cholesterol: 135 mg/dL — ABNORMAL HIGH (ref 0–99)
NonHDL: 151.77
Total CHOL/HDL Ratio: 3
Triglycerides: 83 mg/dL (ref 0.0–149.0)
VLDL: 16.6 mg/dL (ref 0.0–40.0)

## 2022-08-21 LAB — TSH: TSH: 1.01 u[IU]/mL (ref 0.35–5.50)

## 2022-08-21 LAB — VITAMIN D 25 HYDROXY (VIT D DEFICIENCY, FRACTURES): VITD: 42.34 ng/mL (ref 30.00–100.00)

## 2022-08-21 LAB — HEMOGLOBIN A1C: Hgb A1c MFr Bld: 5.6 % (ref 4.6–6.5)

## 2023-08-04 NOTE — Progress Notes (Deleted)
[facility] [address], [address] [place], [place] [address] [phone]  Date:  08/08/2023   Name:  Lavone Schnitzer   DOB:  [birth year]   MRN:  [id number]  PCP:  Pearline Cables, MD    Chief Complaint: No chief complaint on file.   History of Present Illness:  Katherin Parrella is a 47 y.o. very pleasant female patient who presents with the following:  Patient seen today for physical exam Most recent visit with myself was in October of last year also for her physical Generally healthy woman with history of migraine, GERD, depression   At her visit last year her husband had completed treatment for brain tumor and was currently doing every 6 months surveillance scans and medication for seizures Her daughters are 67 and 22  Tetanus booster is due Pap smear Flu shot COVID booster recommended Colonoscopy up-to-date Mammogram February 2022  Lexapro 20 Mirena IUD   Patient Active Problem List   Diagnosis Date Noted   Migraine 01/18/2016   Stress incontinence 08/06/2014   Depression 08/06/2014   Other malaise and fatigue 08/06/2014   GERD (gastroesophageal reflux disease) 08/06/2014   Seasonal allergies 08/06/2014   Overweight (BMI 25.0-29.9) 08/06/2014    Past Medical History:  Diagnosis Date   Anxiety    Asthma    childhood   Depression    Diverticulitis    HSV-2 infection    Hypertension    white coat   Migraine     Past Surgical History:  Procedure Laterality Date   APPENDECTOMY     INSERTION OF MESH     2017 bladder   LEEP      Social History   Tobacco Use   Smoking status: Never   Smokeless tobacco: Never  Substance Use Topics   Alcohol use: Yes   Drug use: No    Family History  Problem Relation Age of Onset   Hypertension Mother    Cancer Mother    Crohn's disease Father    Depression Sister    Depression Brother     Allergies  Allergen Reactions   Phenergan  [Promethazine Hcl] Other (See Comments)    Pt advised that she was given a nausea medication when she was 17 caused her to like she could not move. Unsure if this was the medication.    Omnicef [Cefdinir] Rash    Medication list has been reviewed and updated.  Current Outpatient Medications on File Prior to Visit  Medication Sig Dispense Refill   ALPRAZolam (XANAX) 0.25 MG tablet Take 1 tablet (0.25 mg total) by mouth 3 (three) times daily as needed for anxiety. 30 tablet 1   butalbital-aspirin-caffeine (FIORINAL) 50-325-40 MG capsule Take 1 capsule by mouth every 6 (six) hours as needed for headache. 30 capsule 0   escitalopram (LEXAPRO) 20 MG tablet TAKE 1 TABLET(20 MG) BY MOUTH DAILY 90 tablet 3   fluticasone (FLONASE) 50 MCG/ACT nasal spray Place 2 sprays into both nostrils daily. 16 g 0   levonorgestrel (MIRENA) 20 MCG/24HR IUD 1 each by Intrauterine route once.     No current facility-administered medications on file prior to visit.    Review of Systems:  As per HPI- otherwise negative.   Physical Examination: There were no vitals filed for this visit. There were no vitals filed for this visit. There is no height or weight on file to calculate BMI. Ideal Body Weight:  GEN: no acute distress. HEENT: Atraumatic, Normocephalic.  Ears and Nose: No external deformity. CV: RRR, No M/G/R. No JVD. No thrill. No extra heart sounds. PULM: CTA B, no wheezes, crackles, rhonchi. No retractions. No resp. distress. No accessory muscle use. ABD: S, NT, ND, +BS. No rebound. No HSM. EXTR: No c/c/e PSYCH: Normally interactive. Conversant.    Assessment and Plan: *** Physical exam today.  Encouraged healthy diet and exercise routine Signed Abbe Amsterdam, MD

## 2023-08-08 ENCOUNTER — Encounter: Payer: BC Managed Care – PPO | Admitting: Family Medicine

## 2023-08-08 DIAGNOSIS — Z0000 Encounter for general adult medical examination without abnormal findings: Secondary | ICD-10-CM

## 2023-08-08 DIAGNOSIS — R5383 Other fatigue: Secondary | ICD-10-CM

## 2023-08-08 DIAGNOSIS — Z130 Encounter for screening for diseases of the blood and blood-forming organs and certain disorders involving the immune mechanism: Secondary | ICD-10-CM

## 2023-08-08 DIAGNOSIS — Z13220 Encounter for screening for lipoid disorders: Secondary | ICD-10-CM

## 2023-08-08 DIAGNOSIS — Z1329 Encounter for screening for other suspected endocrine disorder: Secondary | ICD-10-CM

## 2023-08-08 DIAGNOSIS — Z131 Encounter for screening for diabetes mellitus: Secondary | ICD-10-CM

## 2023-08-13 NOTE — Progress Notes (Unsigned)
[facility] [address] [place], [place] [address] [phone]  Date:  08/14/2023   Name:  [name] [name]   DOB:  November 05, 1976   MRN:  [id number]  PCP:  Pearline Cables, MD    Chief Complaint: No chief complaint on file.   History of Present Illness:  [name] [name] is a 47 y.o. very pleasant female patient who presents with the following:  Patient seen today for physical exam Most recent visit with myself was in October of last year also for her physical Generally healthy woman with history of migraine, GERD, depression   At her visit last year her husband had completed treatment for brain tumor and was currently doing every 6 months surveillance scans and medication for seizures Her daughters are 29 and 25  Tetanus booster is due Pap smear Flu shot COVID booster recommended Colonoscopy up-to-date Mammogram February 2022  Lexapro 20 Mirena IUD   Patient Active Problem List   Diagnosis Date Noted   Migraine 01/18/2016   Stress incontinence 08/06/2014   Depression 08/06/2014   Other malaise and fatigue 08/06/2014   GERD (gastroesophageal reflux disease) 08/06/2014   Seasonal allergies 08/06/2014   Overweight (BMI 25.0-29.9) 08/06/2014    Past Medical History:  Diagnosis Date   Anxiety    Asthma    childhood   Depression    Diverticulitis    HSV-2 infection    Hypertension    white coat   Migraine     Past Surgical History:  Procedure Laterality Date   APPENDECTOMY     INSERTION OF MESH     2017 bladder   LEEP      Social History   Tobacco Use   Smoking status: Never   Smokeless tobacco: Never  Substance Use Topics   Alcohol use: Yes   Drug use: No    Family History  Problem Relation Age of Onset   Hypertension Mother    Cancer Mother    Crohn's disease Father    Depression Sister    Depression Brother     Allergies  Allergen Reactions   Phenergan  [Promethazine Hcl] Other (See Comments)    Pt advised that she was given a nausea medication when she was 17 caused her to like she could not move. Unsure if this was the medication.    Omnicef [Cefdinir] Rash    Medication list has been reviewed and updated.  Current Outpatient Medications on File Prior to Visit  Medication Sig Dispense Refill   ALPRAZolam (XANAX) 0.25 MG tablet Take 1 tablet (0.25 mg total) by mouth 3 (three) times daily as needed for anxiety. 30 tablet 1   butalbital-aspirin-caffeine (FIORINAL) 50-325-40 MG capsule Take 1 capsule by mouth every 6 (six) hours as needed for headache. 30 capsule 0   escitalopram (LEXAPRO) 20 MG tablet TAKE 1 TABLET(20 MG) BY MOUTH DAILY 90 tablet 3   fluticasone (FLONASE) 50 MCG/ACT nasal spray Place 2 sprays into both nostrils daily. 16 g 0   levonorgestrel (MIRENA) 20 MCG/24HR IUD 1 each by Intrauterine route once.     No current facility-administered medications on file prior to visit.    Review of Systems:  As per HPI- otherwise negative.   Physical Examination: There were no vitals filed for this visit. There were no vitals filed for this visit. There is no height or weight on file to calculate BMI. Ideal Body Weight:  GEN: no acute distress. HEENT: Atraumatic, Normocephalic.  Ears and Nose: No external deformity. CV: RRR, No M/G/R. No JVD. No thrill. No extra heart sounds. PULM: CTA B, no wheezes, crackles, rhonchi. No retractions. No resp. distress. No accessory muscle use. ABD: S, NT, ND, +BS. No rebound. No HSM. EXTR: No c/c/e PSYCH: Normally interactive. Conversant.    Assessment and Plan: *** Physical exam today.  Encouraged healthy diet and exercise routine Signed Abbe Amsterdam, MD

## 2023-08-13 NOTE — Patient Instructions (Incomplete)
It was great to see you again today, I will be in touch with your labs

## 2023-08-14 ENCOUNTER — Ambulatory Visit: Payer: BC Managed Care – PPO | Admitting: Family Medicine

## 2023-08-14 ENCOUNTER — Encounter: Payer: Self-pay | Admitting: Family Medicine

## 2023-08-14 VITALS — BP 116/80 | HR 72 | Resp 18 | Ht 68.0 in | Wt 187.0 lb

## 2023-08-14 DIAGNOSIS — Z23 Encounter for immunization: Secondary | ICD-10-CM

## 2023-08-14 DIAGNOSIS — Z0000 Encounter for general adult medical examination without abnormal findings: Secondary | ICD-10-CM | POA: Diagnosis not present

## 2023-08-14 DIAGNOSIS — N926 Irregular menstruation, unspecified: Secondary | ICD-10-CM

## 2023-08-14 DIAGNOSIS — Z1329 Encounter for screening for other suspected endocrine disorder: Secondary | ICD-10-CM | POA: Diagnosis not present

## 2023-08-14 DIAGNOSIS — Z13220 Encounter for screening for lipoid disorders: Secondary | ICD-10-CM | POA: Diagnosis not present

## 2023-08-14 DIAGNOSIS — R5383 Other fatigue: Secondary | ICD-10-CM

## 2023-08-14 DIAGNOSIS — Z131 Encounter for screening for diabetes mellitus: Secondary | ICD-10-CM | POA: Diagnosis not present

## 2023-08-14 DIAGNOSIS — Z130 Encounter for screening for diseases of the blood and blood-forming organs and certain disorders involving the immune mechanism: Secondary | ICD-10-CM

## 2023-08-15 ENCOUNTER — Encounter: Payer: Self-pay | Admitting: Family Medicine

## 2023-08-15 LAB — LIPID PANEL
Cholesterol: 240 mg/dL — ABNORMAL HIGH (ref 0–200)
HDL: 77.7 mg/dL (ref >39.00)
LDL Cholesterol: 131 mg/dL — ABNORMAL HIGH (ref 0–99)
NonHDL: 162.36
Total CHOL/HDL Ratio: 3
Triglycerides: 158 mg/dL — ABNORMAL HIGH (ref 0.0–149.0)
VLDL: 31.6 mg/dL (ref 0.0–40.0)

## 2023-08-15 LAB — COMPREHENSIVE METABOLIC PANEL
ALT: 11 U/L (ref 0–35)
AST: 13 U/L (ref 0–37)
Albumin: 4.2 g/dL (ref 3.5–5.2)
Alkaline Phosphatase: 40 U/L (ref 39–117)
BUN: 16 mg/dL (ref 6–23)
CO2: 28 mEq/L (ref 19–32)
Calcium: 9.2 mg/dL (ref 8.4–10.5)
Chloride: 102 mEq/L (ref 96–112)
Creatinine, Ser: 0.83 mg/dL (ref 0.40–1.20)
GFR: 83.59 mL/min (ref >60.00)
Glucose, Bld: 89 mg/dL (ref 70–99)
Potassium: 4.2 mEq/L (ref 3.5–5.1)
Sodium: 138 mEq/L (ref 135–145)
Total Bilirubin: 0.4 mg/dL (ref 0.2–1.2)
Total Protein: 7 g/dL (ref 6.0–8.3)

## 2023-08-15 LAB — HEMOGLOBIN A1C: Hgb A1c MFr Bld: 5.4 % (ref 4.6–6.5)

## 2023-08-15 LAB — CBC
HCT: 44 % (ref 36.0–46.0)
Hemoglobin: 14.7 g/dL (ref 12.0–15.0)
MCHC: 33.4 g/dL (ref 30.0–36.0)
MCV: 86.8 fl (ref 78.0–100.0)
Platelets: 252 10*3/uL (ref 150.0–400.0)
RBC: 5.07 Mil/uL (ref 3.87–5.11)
RDW: 12.8 % (ref 11.5–15.5)
WBC: 7.8 10*3/uL (ref 4.0–10.5)

## 2023-08-15 LAB — TSH: TSH: 1.01 u[IU]/mL (ref 0.35–5.50)

## 2023-08-15 LAB — FOLLICLE STIMULATING HORMONE: FSH: 7.3 m[IU]/mL

## 2023-08-15 LAB — VITAMIN D 25 HYDROXY (VIT D DEFICIENCY, FRACTURES): VITD: 28.69 ng/mL — ABNORMAL LOW (ref 30.00–100.00)

## 2023-08-29 ENCOUNTER — Encounter: Payer: Self-pay | Admitting: Family Medicine

## 2023-10-07 ENCOUNTER — Other Ambulatory Visit: Payer: Self-pay | Admitting: Family Medicine

## 2023-10-07 DIAGNOSIS — F3289 Other specified depressive episodes: Secondary | ICD-10-CM

## 2023-10-24 ENCOUNTER — Encounter (INDEPENDENT_AMBULATORY_CARE_PROVIDER_SITE_OTHER): Payer: BC Managed Care – PPO | Admitting: Family Medicine

## 2023-10-24 DIAGNOSIS — F3289 Other specified depressive episodes: Secondary | ICD-10-CM | POA: Diagnosis not present

## 2023-10-24 DIAGNOSIS — F430 Acute stress reaction: Secondary | ICD-10-CM

## 2023-10-25 MED ORDER — ESCITALOPRAM OXALATE 20 MG PO TABS: ORAL_TABLET | ORAL | 3 refills | Status: DC

## 2023-10-25 MED ORDER — ALPRAZOLAM 0.25 MG PO TABS: 0.2500 mg | ORAL_TABLET | Freq: Two times a day (BID) | ORAL | 1 refills | Status: AC | PRN

## 2023-10-25 NOTE — Addendum Note (Signed)
Addended by: Abbe Amsterdam C on: 10/25/2023 11:57 AM   Modules accepted: Orders

## 2023-10-25 NOTE — Progress Notes (Deleted)
[facility] at [facility] [address] [place], [place] [address] [phone]  Date:  10/30/2023   Name:  [name] [name]   DOB:  18-Dec-1975   MRN:  [id number]  PCP:  Pearline Cables, MD    Chief Complaint: No chief complaint on file.   History of Present Illness:  [name] [name] is a 47 y.o. very pleasant female patient who presents with the following:  Pt seen today to discuss medication for depression She actually contacted me last week via mychart: Dr. Patsy Lager, I wanted to talk to you about restarting my depression medicine. I have been struggling since I weened off of it in Aug/Sep. I have had a lot going on and contributed my struggles to all the stress I have been under. However it is not getting better/easier. I have not been sleeping and suffering from headaches. I made a virtual appointment and thought it was for this morning, however when I tried to join it I realized it is actually next week. I was hoping to talk to you or come see you before then   We had her back back on her lexapro at 10 mg, plan to increase to 20mg  Also refilled alprazolam    Patient Active Problem List   Diagnosis Date Noted   Migraine 01/18/2016   Stress incontinence 08/06/2014   Depression 08/06/2014   Other malaise and fatigue 08/06/2014   GERD (gastroesophageal reflux disease) 08/06/2014   Seasonal allergies 08/06/2014   Overweight (BMI 25.0-29.9) 08/06/2014    Past Medical History:  Diagnosis Date   Anxiety    Asthma    childhood   Depression    Diverticulitis    HSV-2 infection    Hypertension    white coat   Migraine     Past Surgical History:  Procedure Laterality Date   APPENDECTOMY     INSERTION OF MESH     2017 bladder   LEEP      Social History   Tobacco Use   Smoking status: Never   Smokeless tobacco: Never  Substance Use Topics   Alcohol use: Yes   Drug use: No    Family History   Problem Relation Age of Onset   Hypertension Mother    Cancer Mother    Crohn's disease Father    Depression Sister    Depression Brother     Allergies  Allergen Reactions   Phenergan [Promethazine Hcl] Other (See Comments)    Pt advised that she was given a nausea medication when she was 17 caused her to like she could not move. Unsure if this was the medication.    Omnicef [Cefdinir] Rash    Medication list has been reviewed and updated.  Current Outpatient Medications on File Prior to Visit  Medication Sig Dispense Refill   ALPRAZolam (XANAX) 0.25 MG tablet Take 1 tablet (0.25 mg total) by mouth 2 (two) times daily as needed for anxiety. 15 tablet 1   butalbital-aspirin-caffeine (FIORINAL) 50-325-40 MG capsule Take 1 capsule by mouth every 6 (six) hours as needed for headache. 30 capsule 0   escitalopram (LEXAPRO) 20 MG tablet TAKE 1 TABLET(20 MG) BY MOUTH DAILY 90 tablet 3   fluticasone (FLONASE) 50 MCG/ACT nasal spray Place 2 sprays into both nostrils daily. 16 g 0   levonorgestrel (MIRENA) 20 MCG/24HR IUD 1 each by Intrauterine route once.     No current facility-administered medications on file prior to  visit.    Review of Systems:  As per HPI- otherwise negative.   Physical Examination: There were no vitals filed for this visit. There were no vitals filed for this visit. There is no height or weight on file to calculate BMI. Ideal Body Weight:    ***  Assessment and Plan: ***  Signed Abbe Amsterdam, MD

## 2023-10-25 NOTE — Addendum Note (Signed)
Addended by: Abbe Amsterdam C on: 10/25/2023 09:15 AM   Modules accepted: Orders

## 2023-10-29 NOTE — Telephone Encounter (Signed)

## 2023-10-30 ENCOUNTER — Telehealth: Payer: BC Managed Care – PPO | Admitting: Family Medicine

## 2023-10-30 DIAGNOSIS — Z538 Procedure and treatment not carried out for other reasons: Secondary | ICD-10-CM

## 2023-11-01 NOTE — Progress Notes (Signed)
Canceled.

## 2023-12-02 ENCOUNTER — Encounter: Payer: Self-pay | Admitting: Family Medicine

## 2024-05-20 ENCOUNTER — Encounter: Payer: Self-pay | Admitting: Family Medicine

## 2024-06-08 ENCOUNTER — Ambulatory Visit: Admitting: Family Medicine

## 2024-06-08 ENCOUNTER — Telehealth: Payer: Self-pay

## 2024-06-08 VITALS — BP 138/83 | HR 63 | Temp 98.3°F | Resp 16 | Ht 68.0 in | Wt 186.0 lb

## 2024-06-08 DIAGNOSIS — R233 Spontaneous ecchymoses: Secondary | ICD-10-CM | POA: Diagnosis not present

## 2024-06-08 DIAGNOSIS — Z131 Encounter for screening for diabetes mellitus: Secondary | ICD-10-CM | POA: Diagnosis not present

## 2024-06-08 DIAGNOSIS — R5383 Other fatigue: Secondary | ICD-10-CM

## 2024-06-08 DIAGNOSIS — G5702 Lesion of sciatic nerve, left lower limb: Secondary | ICD-10-CM | POA: Diagnosis not present

## 2024-06-08 DIAGNOSIS — S86012A Strain of left Achilles tendon, initial encounter: Secondary | ICD-10-CM | POA: Diagnosis not present

## 2024-06-08 DIAGNOSIS — N926 Irregular menstruation, unspecified: Secondary | ICD-10-CM

## 2024-06-08 DIAGNOSIS — G5601 Carpal tunnel syndrome, right upper limb: Secondary | ICD-10-CM

## 2024-06-08 DIAGNOSIS — Z13220 Encounter for screening for lipoid disorders: Secondary | ICD-10-CM | POA: Diagnosis not present

## 2024-06-08 DIAGNOSIS — E559 Vitamin D deficiency, unspecified: Secondary | ICD-10-CM

## 2024-06-08 NOTE — Progress Notes (Signed)
 [facility] at [facility] [address], [address] [place], [place] [address] [phone]  Date:  06/08/2024   Name:  Homer Pfeifer   DOB:  September 12, 1976   MRN:  [id number]  PCP:  Watt Harlene BROCKS, MD    Chief Complaint: No chief complaint on file.   History of Present Illness:  Merrisa Skorupski is a 48 y.o. very pleasant female patient who presents with the following:  Patient seen today for an acute visit with concern of tingling and swelling in her fingers and weight gain Most recent visit with myself was in September for physical  Generally healthy woman with history of migraine, GERD, depression    Her husband has history of brain tumor, he has completed treatment and is under surveillance  Her oldest daughter is in college, younger daughter in high school  For the last few months (approx 6) she will notice her right hand may be stiff and numb -she may notice it if she wakes up during the night and it bothers her also in the morning.  The pain will sometimes run up her right arm to the elbow  In addition, a month or 2 ago she noted an unusual bruise over her right elbow, she is not sure where it came from.  It was not tender.  It resolved but then seemed to recur a few days ago The left arm and hand seems fine  Mostly her 2nd/3rd/4th fingers bother her   She is hoping to lose weight; we discussed this some today.  Unfortunately I do not think she will qualify for insurance coverage of a GLP-1.  We could consider phentermine but I advised her this is of somewhat limited utility. She has used a low carb diet in the past which worked for her but was hard to keep up She also has a bump on her left achilles which is uncomfortable.  She is not quite sure how long it has been there.  Finally, she notes she will get sciatica type pain in her left buttock down to the thigh especially if she sits for a long time such as when she is  driving.  The right side is okay  Patient Active Problem List   Diagnosis Date Noted   Migraine 01/18/2016   Stress incontinence 08/06/2014   Depression 08/06/2014   Other malaise and fatigue 08/06/2014   GERD (gastroesophageal reflux disease) 08/06/2014   Seasonal allergies 08/06/2014   Overweight (BMI 25.0-29.9) 08/06/2014    Past Medical History:  Diagnosis Date   Anxiety    Asthma    childhood   Depression    Diverticulitis    HSV-2 infection    Hypertension    white coat   Migraine     Past Surgical History:  Procedure Laterality Date   APPENDECTOMY     INSERTION OF MESH     2017 bladder   LEEP      Social History   Tobacco Use   Smoking status: Never   Smokeless tobacco: Never  Substance Use Topics   Alcohol use: Yes   Drug use: No    Family History  Problem Relation Age of Onset   Hypertension Mother    Cancer Mother    Crohn's disease Father    Depression Sister    Depression Brother     Allergies  Allergen Reactions   Phenergan  [Promethazine  Hcl] Other (See Comments)    Pt advised  that she was given a nausea medication when she was 17 caused her to like she could not move. Unsure if this was the medication.    Omnicef [Cefdinir] Rash    Medication list has been reviewed and updated.  Current Outpatient Medications on File Prior to Visit  Medication Sig Dispense Refill   ALPRAZolam  (XANAX ) 0.25 MG tablet Take 1 tablet (0.25 mg total) by mouth 2 (two) times daily as needed for anxiety. 15 tablet 1   butalbital -aspirin -caffeine  (FIORINAL ) 50-325-40 MG capsule Take 1 capsule by mouth every 6 (six) hours as needed for headache. 30 capsule 0   escitalopram  (LEXAPRO ) 20 MG tablet TAKE 1 TABLET(20 MG) BY MOUTH DAILY 90 tablet 3   fluticasone  (FLONASE ) 50 MCG/ACT nasal spray Place 2 sprays into both nostrils daily. 16 g 0   levonorgestrel (MIRENA) 20 MCG/24HR IUD 1 each by Intrauterine route once.     No current facility-administered medications  on file prior to visit.    Review of Systems:  As per HPI- otherwise negative.   Physical Examination: Vitals:   06/08/24 1502  BP: 138/83  Pulse: 63  Resp: 16  Temp: 98.3 F (36.8 C)  SpO2: 97%   Vitals:   06/08/24 1502  Weight: 186 lb (84.4 kg)  Height: 5' 8 (1.727 m)   Body mass index is 28.28 kg/m. Ideal Body Weight: Weight in (lb) to have BMI = 25: 164.1  GEN: no acute distress.  Mildly overweight, looks well HEENT: Atraumatic, Normocephalic.  Bilateral TM wnl, oropharynx normal.  PEERL,EOMI.   Ears and Nose: No external deformity. CV: RRR, No M/G/R. No JVD. No thrill. No extra heart sounds. PULM: CTA B, no wheezes, crackles, rhonchi. No retractions. No resp. distress. No accessory muscle use. ABD: S, NT, ND, +BS. No rebound. No HSM. EXTR: No c/c/e PSYCH: Normally interactive. Conversant.  There is a subtle hyperpigmentation versus resolving bruise over the right lateral elbow Positive wrist flexion test on the right.  Normal strength and movement of both upper extremities Her left Achilles tendon displays a mildly tender palpable nodule about 1.5 inches above the calcaneus  Assessment and Plan: Strain of left Achilles tendon, initial encounter - Plan: Ambulatory referral to Sports Medicine  Carpal tunnel syndrome on right  Piriformis syndrome of left side  Menstrual irregularity - Plan: FSH  Screening for diabetes mellitus - Plan: Comprehensive metabolic panel with GFR, Hemoglobin A1c  Screening for hyperlipidemia - Plan: Lipid panel  Fatigue, unspecified type - Plan: Comprehensive metabolic panel with GFR, TSH  Easy bruising - Plan: CBC  Vitamin D  deficiency - Plan: VITAMIN D  25 Hydroxy (Vit-D Deficiency, Fractures)  Patient seen today with a few concerns.  Referral made to sports medicine to discuss her left Achilles issue.  I asked her to please be seen a soon as possible and to take it easy until then in case there is any risk of Achilles  rupture Encouraged her to wear a wrist splint especially at night for the next few weeks for carpal tunnel.  She will let me know if this does not resolve her problem.  I also gave her handout of exercises that may be helpful for her piriformis syndrome Routine blood work pending as above  Signed Harlene Schroeder, MD  Addendum 7/22, received labs as below.  Message to patient Results for orders placed or performed in visit on 06/08/24  CBC   Collection Time: 06/08/24  3:32 PM  Result Value Ref Range   WBC 7.0 4.0 -  10.5 K/uL   RBC 5.09 3.87 - 5.11 Mil/uL   Platelets 229.0 150.0 - 400.0 K/uL   Hemoglobin 14.7 12.0 - 15.0 g/dL   HCT 56.5 63.9 - 53.9 %   MCV 85.2 78.0 - 100.0 fl   MCHC 33.8 30.0 - 36.0 g/dL   RDW 86.8 88.4 - 84.4 %  Comprehensive metabolic panel with GFR   Collection Time: 06/08/24  3:32 PM  Result Value Ref Range   Sodium 137 135 - 145 mEq/L   Potassium 4.1 3.5 - 5.1 mEq/L   Chloride 103 96 - 112 mEq/L   CO2 25 19 - 32 mEq/L   Glucose, Bld 88 70 - 99 mg/dL   BUN 16 6 - 23 mg/dL   Creatinine, Ser 9.22 0.40 - 1.20 mg/dL   Total Bilirubin 0.4 0.2 - 1.2 mg/dL   Alkaline Phosphatase 37 (L) 39 - 117 U/L   AST 16 0 - 37 U/L   ALT 11 0 - 35 U/L   Total Protein 7.0 6.0 - 8.3 g/dL   Albumin 4.5 3.5 - 5.2 g/dL   GFR 09.05 >39.99 mL/min   Calcium 9.3 8.4 - 10.5 mg/dL  Hemoglobin J8r   Collection Time: 06/08/24  3:32 PM  Result Value Ref Range   Hgb A1c MFr Bld 5.6 4.6 - 6.5 %  Lipid panel   Collection Time: 06/08/24  3:32 PM  Result Value Ref Range   Cholesterol 270 (H) 0 - 200 mg/dL   Triglycerides 826.9 (H) 0.0 - 149.0 mg/dL   HDL 18.69 >60.99 mg/dL   VLDL 65.3 0.0 - 59.9 mg/dL   LDL Cholesterol 845 (H) 0 - 99 mg/dL   Total CHOL/HDL Ratio 3    NonHDL 188.40   TSH   Collection Time: 06/08/24  3:32 PM  Result Value Ref Range   TSH 0.95 0.35 - 5.50 uIU/mL  VITAMIN D  25 Hydroxy (Vit-D Deficiency, Fractures)   Collection Time: 06/08/24  3:32 PM  Result Value  Ref Range   VITD 34.03 30.00 - 100.00 ng/mL  [facility]   Collection Time: 06/08/24  3:32 PM  Result Value Ref Range   FSH 13.4 mIU/ML

## 2024-06-08 NOTE — Telephone Encounter (Signed)
 Initial Comment Caller states made appt online through mychart; office wants her triaged; having bruise on elbow month ago, went away and came back; fingers are numb and tingly; Translation No Nurse Assessment Nurse: Arthea, RN, Clarita Date/Time (Eastern Time): 06/08/2024 11:00:51 AM Confirm and document reason for call. If symptomatic, describe symptoms. ---Caller states had a bruise on R elbow, a month ago. Went away, then reappeared Friday. Before the bruise a month ago, noticed tingling on and off to the fingers / and tightness of fingers to right hand. Puffiness is in the morning. Does the patient have any new or worsening symptoms? ---Yes Will a triage be completed? ---Yes Related visit to physician within the last 2 weeks? ---No Does the PT have any chronic conditions? (i.e. diabetes, asthma, this includes High risk factors for pregnancy, etc.) ---Yes List chronic conditions. ---mental health meds, IUD Is the patient pregnant or possibly pregnant? (Ask all females between the ages of 35-55) ---No Is this a behavioral health or substance abuse call? ---No Guidelines Guideline Title Affirmed Question Affirmed Notes Nurse Date/Time (Eastern Time) Bruises [1] Less than 5 unexplained bruises now AND [2] NOT caused by an injury Arthea, RN, Clarita 06/08/2024 11:03:49 AM PLEASE NOTE: All timestamps contained within this report are represented as [place] Standard Time. CONFIDENTIALTY NOTICE: This fax transmission is intended only for the addressee. It contains information that is legally privileged, confidential or otherwise protected from use or disclosure. If you are not the intended recipient, you are strictly prohibited from reviewing, disclosing, copying using or disseminating any of this information or taking any action in reliance on or regarding this information. If you have received this fax in error, please notify us  immediately by telephone so that we can arrange for  its return to us . Phone: [phone], Toll-Free: [phone], Fax: [phone] CYNTHIA_SHANER Sep 22, 1976 Page: 1 of2 CallId: [id number] Disp. Time Titus Time) Disposition Final User 06/08/2024 10:56:03 AM Send to Urgent Queue Mathew Givens 06/08/2024 11:07:30 AM SEE PCP WITHIN 3 DAYS Yes Arthea RN, Clarita Final Disposition 06/08/2024 11:07:30 AM SEE PCP WITHIN 3 DAYS Yes Arthea, RN, Clarita [place] Disagree/Comply Comply Caller Understands Yes PreDisposition Call Doctor Care Advice Given Per Guideline SEE PCP WITHIN 3 DAYS: * You need to be seen within 2 or 3 days. AVOID ASPIRIN : * Do not take aspirin  for treatment of pain, unless your doctor has specifically told you to take it for a medical condition. * Aspirin  is a mild blood thinner and can make you bruise easier. CALL BACK IF: * You become worse CARE ADVICE given per Bruises (Adult) guideline. EXPECTED COURSE: * A bruise can continue to look worse for the first 24 hours after the injury. Comments User: Clarita Arthea, RN Date/Time Titus Time): 06/08/2024 11:07:03 AM gums bleed with flossing User: Clarita Arthea, RN Date/Time Titus Time): 06/08/2024 11:09:35 AM numbness and tingling to the right on and off for 3 mos.

## 2024-06-08 NOTE — Patient Instructions (Addendum)
 Please wear a wrist splint which prevent forward bending of your wrist at night for 2-3 weeks. Let me know if this does not resolve the issue with your right hand going numb  Please make an appt with sports med asap for your left achilles Try some of the exercises I gave you for your piriformis syndrome/ sciatica If this fails formal PT may be helpful for you  I will be in touch with your labs

## 2024-06-08 NOTE — Telephone Encounter (Signed)
Appt scheduled w/ PCP.  

## 2024-06-09 ENCOUNTER — Encounter: Payer: Self-pay | Admitting: Family Medicine

## 2024-06-09 ENCOUNTER — Encounter: Payer: Self-pay | Admitting: Sports Medicine

## 2024-06-09 ENCOUNTER — Ambulatory Visit: Admitting: Sports Medicine

## 2024-06-09 VITALS — BP 114/80 | Ht 68.0 in | Wt 186.0 lb

## 2024-06-09 DIAGNOSIS — M7660 Achilles tendinitis, unspecified leg: Secondary | ICD-10-CM

## 2024-06-09 LAB — COMPREHENSIVE METABOLIC PANEL WITH GFR
ALT: 11 U/L (ref 0–35)
AST: 16 U/L (ref 0–37)
Albumin: 4.5 g/dL (ref 3.5–5.2)
Alkaline Phosphatase: 37 U/L — ABNORMAL LOW (ref 39–117)
BUN: 16 mg/dL (ref 6–23)
CO2: 25 meq/L (ref 19–32)
Calcium: 9.3 mg/dL (ref 8.4–10.5)
Chloride: 103 meq/L (ref 96–112)
Creatinine, Ser: 0.77 mg/dL (ref 0.40–1.20)
GFR: 90.94 mL/min (ref >60.00)
Glucose, Bld: 88 mg/dL (ref 70–99)
Potassium: 4.1 meq/L (ref 3.5–5.1)
Sodium: 137 meq/L (ref 135–145)
Total Bilirubin: 0.4 mg/dL (ref 0.2–1.2)
Total Protein: 7 g/dL (ref 6.0–8.3)

## 2024-06-09 LAB — FOLLICLE STIMULATING HORMONE: FSH: 13.4 m[IU]/mL

## 2024-06-09 LAB — VITAMIN D 25 HYDROXY (VIT D DEFICIENCY, FRACTURES): VITD: 34.03 ng/mL (ref 30.00–100.00)

## 2024-06-09 LAB — CBC
HCT: 43.4 % (ref 36.0–46.0)
Hemoglobin: 14.7 g/dL (ref 12.0–15.0)
MCHC: 33.8 g/dL (ref 30.0–36.0)
MCV: 85.2 fl (ref 78.0–100.0)
Platelets: 229 K/uL (ref 150.0–400.0)
RBC: 5.09 Mil/uL (ref 3.87–5.11)
RDW: 13.1 % (ref 11.5–15.5)
WBC: 7 K/uL (ref 4.0–10.5)

## 2024-06-09 LAB — LIPID PANEL
Cholesterol: 270 mg/dL — ABNORMAL HIGH (ref 0–200)
HDL: 81.3 mg/dL (ref >39.00)
LDL Cholesterol: 154 mg/dL — ABNORMAL HIGH (ref 0–99)
NonHDL: 188.4
Total CHOL/HDL Ratio: 3
Triglycerides: 173 mg/dL — ABNORMAL HIGH (ref 0.0–149.0)
VLDL: 34.6 mg/dL (ref 0.0–40.0)

## 2024-06-09 LAB — TSH: TSH: 0.95 u[IU]/mL (ref 0.35–5.50)

## 2024-06-09 LAB — HEMOGLOBIN A1C: Hgb A1c MFr Bld: 5.6 % (ref 4.6–6.5)

## 2024-06-09 NOTE — Progress Notes (Signed)
   Subjective:    Patient ID: [name] [name], female    DOB: 01-31-1976, 48 y.o.   MRN: [id number]  HPI chief complaint: Lump on my left Achilles  Patient is a very pleasant 48 year old female who presents today with 1 to 2 months of swelling in the mid substance of the left Achilles.  No trauma.  She does have a history of an identical problem in the past which resolved on its own.  She does notice tenderness to direct touch in the area.  It is worse first thing in the morning.  She has been trying some range of motion exercises and stretching.  Otherwise, she has not tried any other treatment.  She recently saw her PCP who recommended that she come to our office for evaluation.  Past medical history reviewed Medications reviewed Allergies reviewed   Review of Systems As above    Objective:   Physical Exam  Adult, well-nourished.  No acute distress  Left Achilles: There is fusiform swelling of the mid substance of the left Achilles.  It is tender to palpation.  Tendon is intact.  Bedside ultrasound of the left Achilles shows fusiform swelling of the tendon in the mid substance.  There are a few hypoechoic changes consistent with tendinopathy.  Tendon is intact.      Assessment & Plan:   Left Achilles tendinopathy  Alfredson heel drop exercises to be done daily.  She may also utilize daily icing in the form of an ice massage for 10 minutes at a time.  We also discussed wearing shoes with elevated heels  if she experiences pain with walking.  If symptoms persist or worsen then consider merits of formal physical therapy at that time.  Follow-up for ongoing or recalcitrant issues.  This note was dictated using Dragon naturally speaking software and may contain errors in syntax, spelling, or content which have not been identified prior to signing this note.

## 2024-06-11 ENCOUNTER — Ambulatory Visit: Admitting: Family Medicine

## 2024-07-23 ENCOUNTER — Encounter: Payer: Self-pay | Admitting: Family Medicine

## 2024-11-23 ENCOUNTER — Encounter: Payer: Self-pay | Admitting: Family Medicine

## 2024-11-24 ENCOUNTER — Other Ambulatory Visit: Payer: Self-pay | Admitting: Family Medicine

## 2024-11-24 DIAGNOSIS — F3289 Other specified depressive episodes: Secondary | ICD-10-CM
